# Patient Record
Sex: Female | Born: 1960 | Race: White | Hispanic: No | Marital: Married | Smoking: Current every day smoker
Health system: Southern US, Community
[De-identification: ages and names within clinical notes are randomized; demographics above are authoritative.]

## PROBLEM LIST (undated history)

## (undated) DIAGNOSIS — F329 Major depressive disorder, single episode, unspecified: Secondary | ICD-10-CM

## (undated) DIAGNOSIS — N200 Calculus of kidney: Secondary | ICD-10-CM

## (undated) DIAGNOSIS — F32A Depression, unspecified: Secondary | ICD-10-CM

## (undated) DIAGNOSIS — E119 Type 2 diabetes mellitus without complications: Secondary | ICD-10-CM

## (undated) DIAGNOSIS — Z87442 Personal history of urinary calculi: Secondary | ICD-10-CM

## (undated) DIAGNOSIS — K219 Gastro-esophageal reflux disease without esophagitis: Secondary | ICD-10-CM

## (undated) HISTORY — DX: Calculus of kidney: N20.0

## (undated) HISTORY — PX: APPENDECTOMY: SHX54

## (undated) HISTORY — PX: EXPLORATORY LAPAROTOMY: SUR591

## (undated) HISTORY — PX: CHOLECYSTECTOMY: SHX55

## (undated) HISTORY — PX: ABDOMINAL HYSTERECTOMY: SHX81

## (undated) HISTORY — PX: KIDNEY STONE SURGERY: SHX686

## (undated) HISTORY — PX: TONSILLECTOMY: SUR1361

## (undated) HISTORY — PX: OSTEOTOMY MANDIBULAR: SUR981

---

## 2004-04-29 ENCOUNTER — Ambulatory Visit: Payer: Self-pay

## 2004-10-26 ENCOUNTER — Ambulatory Visit: Payer: Self-pay | Admitting: Unknown Physician Specialty

## 2009-09-29 ENCOUNTER — Ambulatory Visit: Payer: Self-pay

## 2009-10-06 ENCOUNTER — Emergency Department: Payer: Self-pay | Admitting: Emergency Medicine

## 2009-10-24 ENCOUNTER — Emergency Department (HOSPITAL_COMMUNITY): Admission: EM | Admit: 2009-10-24 | Discharge: 2009-10-24 | Payer: Self-pay | Admitting: Emergency Medicine

## 2009-10-29 ENCOUNTER — Ambulatory Visit: Payer: Self-pay | Admitting: Urology

## 2010-05-05 LAB — POCT URINALYSIS DIPSTICK
Hgb urine dipstick: NEGATIVE
Ketones, ur: NEGATIVE mg/dL
Nitrite: NEGATIVE
Protein, ur: NEGATIVE mg/dL
pH: 5.5 (ref 5.0–8.0)

## 2010-12-01 ENCOUNTER — Ambulatory Visit: Payer: Self-pay | Admitting: Urology

## 2010-12-21 ENCOUNTER — Ambulatory Visit: Payer: Self-pay

## 2010-12-27 ENCOUNTER — Other Ambulatory Visit: Payer: Self-pay

## 2013-04-12 ENCOUNTER — Emergency Department (HOSPITAL_BASED_OUTPATIENT_CLINIC_OR_DEPARTMENT_OTHER): Payer: BC Managed Care – PPO

## 2013-04-12 ENCOUNTER — Emergency Department (HOSPITAL_BASED_OUTPATIENT_CLINIC_OR_DEPARTMENT_OTHER)
Admission: EM | Admit: 2013-04-12 | Discharge: 2013-04-12 | Disposition: A | Discharge status: Home / Self Care | Payer: BC Managed Care – PPO | Attending: Emergency Medicine | Admitting: Emergency Medicine

## 2013-04-12 ENCOUNTER — Encounter (HOSPITAL_BASED_OUTPATIENT_CLINIC_OR_DEPARTMENT_OTHER): Payer: Self-pay | Admitting: Emergency Medicine

## 2013-04-12 DIAGNOSIS — Z87891 Personal history of nicotine dependence: Secondary | ICD-10-CM | POA: Insufficient documentation

## 2013-04-12 DIAGNOSIS — Z79899 Other long term (current) drug therapy: Secondary | ICD-10-CM | POA: Insufficient documentation

## 2013-04-12 DIAGNOSIS — K219 Gastro-esophageal reflux disease without esophagitis: Secondary | ICD-10-CM | POA: Insufficient documentation

## 2013-04-12 DIAGNOSIS — E119 Type 2 diabetes mellitus without complications: Secondary | ICD-10-CM | POA: Insufficient documentation

## 2013-04-12 DIAGNOSIS — J45901 Unspecified asthma with (acute) exacerbation: Secondary | ICD-10-CM | POA: Insufficient documentation

## 2013-04-12 DIAGNOSIS — Z880 Allergy status to penicillin: Secondary | ICD-10-CM | POA: Insufficient documentation

## 2013-04-12 DIAGNOSIS — J40 Bronchitis, not specified as acute or chronic: Secondary | ICD-10-CM

## 2013-04-12 DIAGNOSIS — R609 Edema, unspecified: Secondary | ICD-10-CM | POA: Insufficient documentation

## 2013-04-12 DIAGNOSIS — IMO0002 Reserved for concepts with insufficient information to code with codable children: Secondary | ICD-10-CM | POA: Insufficient documentation

## 2013-04-12 HISTORY — DX: Gastro-esophageal reflux disease without esophagitis: K21.9

## 2013-04-12 HISTORY — DX: Type 2 diabetes mellitus without complications: E11.9

## 2013-04-12 MED ORDER — PREDNISONE 20 MG PO TABS: ORAL_TABLET | ORAL | Status: DC

## 2013-04-12 MED ORDER — ALBUTEROL SULFATE HFA 108 (90 BASE) MCG/ACT IN AERS
2.0000 | INHALATION_SPRAY | RESPIRATORY_TRACT | Status: DC
  Administered 2013-04-12: 2 via RESPIRATORY_TRACT
  Filled 2013-04-12: qty 6.7

## 2013-04-12 NOTE — ED Provider Notes (Addendum)
CSN: [id number]     Arrival date & time 04/12/13  1502 History  This chart was scribed for Rolan BuccoMelanie Analynn Daum, MD by Dorothey Basemania Sutton, ED Scribe. This patient was seen in room MH11/MH11 and the patient's care was started at 4:38 PM.    Chief Complaint  Patient presents with  . Cough   The history is provided by the patient. No language interpreter was used.   HPI Comments: Gina Oneill is a 53 y.o. Female with a history of reactive airway disease who presents to the Emergency Department complaining of a dry cough with associated, intermittent chest pain that she states has been ongoing for the past 3-4 months, but has been progressively worsening over the past few days due to the recent cold weather. She describes the chest pain as a stabbing sensation, most localized on the left side of the chest, with associated shortness of breath and wheezing that usually only presents with exertion. Patient states that she measured her pulse oximetry at home last night during one of these episodes and it was 90-91% during a cough/wheezing spell. She denies any of these symptoms at this time, but reports some associated soreness to the right side of the mid-back secondary to the cough that she states is exacerbated with coughing. She states that she finished a course of Levaquin 2-3 weeks ago for bronchitis, but has had some persistent rhinorrhea. Patient reports using Advair at home with mild, temporary relief. She denies fever, unusual leg swelling, emesis, diarrhea. Patient also has a history of DM that she states is well-controlled with metformin. Patient reports that she quit smoking about 2 years ago. She has had cough/cold symptoms since January.  Has been on 3 rounds of abx.  Started on advair.  Diagnosed with RAD.    Past Medical History  Diagnosis Date  . Diabetes mellitus without complication   . GERD (gastroesophageal reflux disease)   . Asthma    Past Surgical History  Procedure Laterality Date  .  Cholecystectomy    . Abdominal hysterectomy    . Appendectomy    . Osteotomy mandibular     No family history on file. History  Substance Use Topics  . Smoking status: Former Games developermoker  . Smokeless tobacco: Not on file  . Alcohol Use: No   OB History   Grav Para Term Preterm Abortions TAB SAB Ect Mult Living                 Review of Systems  Constitutional: Negative for fever, chills, diaphoresis and fatigue.  HENT: Positive for rhinorrhea. Negative for congestion and sneezing.   Eyes: Negative.   Respiratory: Positive for cough, shortness of breath and wheezing. Negative for chest tightness.   Cardiovascular: Positive for chest pain. Negative for leg swelling.  Gastrointestinal: Negative for nausea, vomiting, abdominal pain, diarrhea and blood in stool.  Genitourinary: Negative for frequency, hematuria, flank pain and difficulty urinating.  Musculoskeletal: Positive for back pain. Negative for arthralgias.  Skin: Negative for rash.  Neurological: Negative for dizziness, speech difficulty, weakness, numbness and headaches.   Allergies  Penicillins and Sulfa antibiotics  Home Medications   Current Outpatient Rx  Name  Route  Sig  Dispense  Refill  . FLUoxetine (PROZAC) 40 MG capsule   Oral   Take 40 mg by mouth daily.         . Fluticasone-Salmeterol (ADVAIR) 250-50 MCG/DOSE AEPB   Inhalation   Inhale 1 puff into the lungs 2 (two) times daily.         .Marland Kitchen  metFORMIN (GLUCOPHAGE) 500 MG tablet   Oral   Take by mouth daily with breakfast.         . predniSONE (DELTASONE) 20 MG tablet      3 tabs po day one, then 2 po daily x 4 days   11 tablet   0    BP 136/85  Pulse 77  Temp(Src) 98.3 F (36.8 C) (Oral)  Resp 22  Ht 5' 10.5" (1.791 m)  Wt 220 lb (99.791 kg)  BMI 31.11 kg/m2  SpO2 97%  Physical Exam  Constitutional: She is oriented to person, place, and time. She appears well-developed and well-nourished.  HENT:  Head: Normocephalic and atraumatic.   Right Ear: Hearing, tympanic membrane, external ear and ear canal normal.  Left Ear: Hearing, tympanic membrane, external ear and ear canal normal.  Eyes: Pupils are equal, round, and reactive to light.  Neck: Normal range of motion. Neck supple.  Cardiovascular: Normal rate, regular rhythm and normal heart sounds.   Pulmonary/Chest: Effort normal. No respiratory distress. She has wheezes. She has no rales. She exhibits tenderness.  Reproducible tenderness to palpation to the right mid-lateral and sternal area. Mild expiratory wheezes. No increased work of breathing.   Abdominal: Soft. Bowel sounds are normal. There is no tenderness. There is no rebound and no guarding.  Musculoskeletal: Normal range of motion. She exhibits edema.  Mild swelling to the right calf normal for patient's baseline.   Lymphadenopathy:    She has no cervical adenopathy.  Neurological: She is alert and oriented to person, place, and time.  Skin: Skin is warm and dry. No rash noted.  Psychiatric: She has a normal mood and affect.    ED Course  Procedures (including critical care time)  DIAGNOSTIC STUDIES: Oxygen Saturation is 97% on room air, normal by my interpretation.    COORDINATION OF CARE: 4:46 PM- Discussed that chest x-ray results were negative. Will discharge patient with prednisone and an albuterol inhaler to manage symptoms. Advised her to closely monitor her CBG at home while on the course of steroids. Offered patient Tussionex, but she refused because she states that she does not like its narcotic effects. Return precautions given. Discussed treatment plan with patient at bedside and patient verbalized agreement.     Labs Review Labs Reviewed - No data to display  Imaging Review Dg Chest 2 View  04/12/2013   CLINICAL DATA:  Cough  EXAM: CHEST  2 VIEW  COMPARISON:  None.  FINDINGS: The heart size and mediastinal contours are within normal limits. Both lungs are clear. The visualized skeletal  structures are unremarkable.  IMPRESSION: No active cardiopulmonary disease.   Electronically Signed   By: Ruel Favors M.D.   On: 04/12/2013 15:58    EKG Interpretation    Date/Time:  Saturday April 12 2013 15:10:55 EST Ventricular Rate:  72 PR Interval:  120 QRS Duration: 92 QT Interval:  398 QTC Calculation: 435 R Axis:   79 Text Interpretation:  Normal sinus rhythm Normal ECG No old tracing to compare Confirmed by Yunior Jain  MD, Lameshia Hypolite ([id number]) on 04/12/2013 6:22:34 PM            MDM   Final diagnoses:  Bronchitis    Patient presents with cough and wheezing for the last 2 months. She's been treated with antibiotics. There is no evidence of pneumonia on exam. She has no symptoms that would be more consistent with pulmonary embolus. Her oxygen saturations are normal and she has no tachypnea or  increased work of breathing. She currently denies any chest pain. She has no persistent tachycardia. She has no calf tenderness. She'll be treated with steroids and beta 2 agonist. She was advised close followup with her primary care physician.  I personally performed the services described in this documentation, which was scribed in my presence.  The recorded information has been reviewed and considered.     Rolan Bucco, MD 04/12/13 1737  Rolan Bucco, MD 04/12/13 Rickey Primus

## 2013-04-12 NOTE — Discharge Instructions (Signed)

## 2013-04-12 NOTE — ED Notes (Signed)
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  C/o continued cough, chest discomfort.  Using Advair since last night.  Was using Brio with some relief.  States the cold weather has exacerbated the cough.  Cough is non productive, denies fever.  C/o some musculoskeletal chest pain with movement.

## 2013-05-14 ENCOUNTER — Ambulatory Visit: Payer: Self-pay

## 2013-05-19 ENCOUNTER — Encounter: Payer: Self-pay | Admitting: Internal Medicine

## 2013-05-19 ENCOUNTER — Ambulatory Visit (INDEPENDENT_AMBULATORY_CARE_PROVIDER_SITE_OTHER): Payer: BC Managed Care – PPO | Admitting: Internal Medicine

## 2013-05-19 ENCOUNTER — Encounter (INDEPENDENT_AMBULATORY_CARE_PROVIDER_SITE_OTHER): Payer: Self-pay

## 2013-05-19 ENCOUNTER — Encounter: Payer: Self-pay | Admitting: *Deleted

## 2013-05-19 VITALS — BP 122/80 | HR 67 | Temp 97.9°F | Ht 70.5 in | Wt 219.0 lb

## 2013-05-19 DIAGNOSIS — R059 Cough, unspecified: Secondary | ICD-10-CM

## 2013-05-19 DIAGNOSIS — R058 Other specified cough: Secondary | ICD-10-CM

## 2013-05-19 DIAGNOSIS — R05 Cough: Secondary | ICD-10-CM

## 2013-05-19 MED ORDER — PREDNISONE 10 MG PO TABS
ORAL_TABLET | ORAL | Status: DC
Start: 2013-05-19 — End: 2013-06-02

## 2013-05-19 MED ORDER — PANTOPRAZOLE SODIUM 40 MG PO TBEC: 40.0000 mg | DELAYED_RELEASE_TABLET | Freq: Every day | ORAL | Status: DC

## 2013-05-19 MED ORDER — MOMETASONE FURO-FORMOTEROL FUM 100-5 MCG/ACT IN AERO: INHALATION_SPRAY | RESPIRATORY_TRACT | Status: DC

## 2013-05-19 MED ORDER — ACETAMINOPHEN-CODEINE #3 300-30 MG PO TABS
ORAL_TABLET | ORAL | Status: DC
Start: 2013-05-19 — End: 2013-06-02

## 2013-05-19 MED ORDER — FAMOTIDINE 20 MG PO TABS: ORAL_TABLET | ORAL | Status: DC

## 2013-05-19 NOTE — Patient Instructions (Signed)
If you trouble catching your breath, use dulera 100 up to 2 puffs every 12 hours as needed   Stop advair   Prednisone 10 mg take  4 each am x 2 days,   2 each am x 2 days,  1 each am x 2 days and stop   Pantoprazole (protonix) 40 mg   Take 30-60 min before first meal of the day and Pepcid 20 mg one bedtime until return to office - this is the best way to tell whether stomach acid is contributing to your problem.     GERD (REFLUX)  is an extremely common cause of respiratory symptoms, many times with no significant heartburn at all.    It can be treated with medication, but also with lifestyle changes including avoidance of late meals, excessive alcohol, smoking cessation, and avoid fatty foods, chocolate, peppermint, colas, red wine, and acidic juices such as orange juice.  NO MINT OR MENTHOL PRODUCTS SO NO COUGH DROPS  USE SUGARLESS CANDY INSTEAD (jolley ranchers or Stover's)  NO OIL BASED VITAMINS - use powdered substitutes.    Take delsym two tsp every 12 hours and supplement if needed with  Tylenol #3 one-two every 4 hours to suppress the urge to cough. Swallowing water or using ice chips/non mint and menthol containing candies (such as lifesavers or sugarless jolly ranchers) are also effective.  You should rest your voice and avoid activities that you know make you cough.  Once you have eliminated the cough for 3 straight days try reducing the tylenol #3  first,  then the delsym as tolerated.    Please schedule a follow up office visit in 2 weeks, sooner if needed

## 2013-05-19 NOTE — Progress Notes (Addendum)
Subjective:    Patient ID: Gina FootSheila Oneill, female    DOB: 1960/08/10  MRN: [id number]  HPI  52 yowf quit smoking 11/2010 p dx of asthma ? Around age 53 then again again 1994 by [facility] weaned to advair/singulair/albuterol and eval in the 1990's for allergies on shots by ENT x 3 y no benefit and stopped all meds early 2000 and did fine despite smoking with only flares twice yearly since then controlled with prn tussionex no pred no inhalers and then quit smoking 2012 and then got worse with more freq head /chest congestion rx with freq abx by Dr Vear ClockPhillips more persistent nasal and chest congestion/ then more doe starting Jan 2015 when got new outside bike, better on advair initially in Jan then mid februrary 2015 developed desats assoc with R >L CP so [facility] much better p alb/ prednisone.   05/19/2013 1st [place] [facility] office visit/ Adaria Hole maint onadvair 250 bid  Chief Complaint  Patient presents with  . Pulmonary Consult    Self referral- pt c/o cough, SOB and CP since mid Jan 2015. Pt states that she gets SOB with any exertion and sometimes even with just walking. CP also bothers her "all the time" but worse upon deep inspiration.     cp is diffuse ant > post and only started p the cough which is harsh, barking, upper airway in quality with min mucoid sputum. advair not helping- nothing ever helps including saba   No obvious other patterns in day to day or daytime variabilty or assoc   chest tightness, subjective wheeze overt sinus or hb symptoms. No unusual exp hx or h/o childhood pna/ asthma or knowledge of premature birth.   Also denies any obvious fluctuation of symptoms with weather or environmental changes or other aggravating or alleviating factors except as outlined above   Current Medications, Allergies, Complete Past Medical History, Past Surgical History, Family History, and Social History were reviewed in [facility] electronic medical record.            Review of Systems  Constitutional: Negative for fever, chills and unexpected weight change.  HENT: Positive for ear pain and sore throat. Negative for congestion, dental problem, nosebleeds, postnasal drip, rhinorrhea, sinus pressure, sneezing, trouble swallowing and voice change.   Eyes: Negative for visual disturbance.  Respiratory: Positive for cough and shortness of breath. Negative for choking.   Cardiovascular: Positive for chest pain. Negative for leg swelling.  Gastrointestinal: Negative for vomiting, abdominal pain and diarrhea.  Genitourinary: Negative for difficulty urinating.  Musculoskeletal: Negative for arthralgias.  Skin: Negative for rash.  Neurological: Positive for headaches. Negative for tremors and syncope.  Hematological: Does not bruise/bleed easily.       Objective:   Physical Exam  amb wf nad mod hoarse nad with harsh barking cough   Wt Readings from Last 3 Encounters:  05/19/13 219 lb (99.338 kg)  04/12/13 220 lb (99.791 kg)    HEENT: nl dentition, turbinates, and orophanx. Nl external ear canals without cough reflex   NECK :  without JVD/Nodes/TM/ nl carotid upstrokes bilaterally   LUNGS: no acc muscle use, clear to A and P bilaterally without cough on insp or exp maneuvers   CV:  RRR  no s3 or murmur or increase in P2, no edema   ABD:  soft and nontender with nl excursion in the supine position. No bruits or organomegaly, bowel sounds nl  MS:  warm without deformities, calf tenderness, cyanosis or  clubbing  SKIN: warm and dry without lesions    NEURO:  alert, approp, no deficits    cxr 04/12/13  No active cardiopulmonary disease.  cxr repeated 05/14/13 c/w bronchitis       Assessment & Plan:

## 2013-05-20 DIAGNOSIS — R058 Other specified cough: Secondary | ICD-10-CM | POA: Insufficient documentation

## 2013-05-20 DIAGNOSIS — R05 Cough: Secondary | ICD-10-CM | POA: Insufficient documentation

## 2013-05-20 NOTE — Assessment & Plan Note (Addendum)
The most common causes of chronic cough in immunocompetent adults include the following: upper airway cough syndrome (UACS), previously referred to as postnasal drip syndrome (PNDS), which is caused by variety of rhinosinus conditions; (2) asthma; (3) GERD; (4) chronic bronchitis from cigarette smoking or other inhaled environmental irritants; (5) nonasthmatic eosinophilic bronchitis; and (6) bronchiectasis.   These conditions, singly or in combination, have accounted for up to 94% of the causes of chronic cough in prospective studies.   Other conditions have constituted no >6% of the causes in prospective studies These have included bronchogenic carcinoma, chronic interstitial pneumonia, sarcoidosis, left ventricular failure, ACEI-induced cough, and aspiration from a condition associated with pharyngeal dysfunction.    Chronic cough is often simultaneously caused by more than one condition. A single cause has been found from 38 to 82% of the time, multiple causes from 18 to 62%. Multiply caused cough has been the result of three diseases up to 42% of the time.       Based on hx and exam, this is most likely:  Classic Upper airway cough syndrome, so named because it's frequently impossible to sort out how much is  CR/sinusitis with freq throat clearing (which can be related to primary GERD)   vs  causing  secondary (" extra esophageal")  GERD from wide swings in gastric pressure that occur with throat clearing, often  promoting self use of mint and menthol lozenges that reduce the lower esophageal sphincter tone and exacerbate the problem further in a cyclical fashion.   These are the same pts (now being labeled as having "irritable larynx syndrome" by some cough centers) who not infrequently have a history of having failed to tolerate ace inhibitors,  dry powder inhalers or biphosphonates or report having atypical reflux symptoms that don't respond to standard doses of PPI , and are easily confused as  having aecopd or asthma flares by even experienced allergists/ pulmonologists.   The first step is to maximize acid suppression/GERD rx and eliminate cyclical coughing then regroup if the cough persists. Will hold inhalers unless can't catch breath p first eliminating the cough which I believe is the problem, not the symptom of a problem. Next step if not better > sinus CT/allergy eval   See instructions for specific recommendations which were reviewed directly with the patient who was given a copy with highlighter outlining the key components.

## 2013-05-26 ENCOUNTER — Telehealth: Payer: Self-pay | Admitting: Internal Medicine

## 2013-05-26 NOTE — Telephone Encounter (Signed)
Pt calling again would like a call back asap.Caren GriffinsStanley A Dalton

## 2013-05-26 NOTE — Telephone Encounter (Signed)
Spoke with the Gina Oneill  She states that she is feeling much better Cough has not completely resolved, but almost  She is requesting note to return to work now  Needs to be faxed to Hosie Poissonlizabeth Goad at [phone] She is aware MW out of the office until next wk  Please advise, thanks!

## 2013-05-27 ENCOUNTER — Encounter: Payer: Self-pay | Admitting: *Deleted

## 2013-05-27 NOTE — Telephone Encounter (Signed)
Ok to write letter:  approve for work anytime she wants to return

## 2013-05-27 NOTE — Telephone Encounter (Signed)
Letter created and faxed to Hosie PoissonElizabeth Goad to the number given  [facility] for the pt

## 2013-05-28 NOTE — Telephone Encounter (Signed)
Pt advised. Jennifer Castillo, CMA  

## 2013-06-02 ENCOUNTER — Encounter (INDEPENDENT_AMBULATORY_CARE_PROVIDER_SITE_OTHER): Payer: Self-pay

## 2013-06-02 ENCOUNTER — Ambulatory Visit (INDEPENDENT_AMBULATORY_CARE_PROVIDER_SITE_OTHER): Payer: BC Managed Care – PPO | Admitting: Internal Medicine

## 2013-06-02 ENCOUNTER — Encounter: Payer: Self-pay | Admitting: Internal Medicine

## 2013-06-02 VITALS — BP 114/80 | HR 63 | Temp 98.6°F | Ht 70.5 in | Wt 216.8 lb

## 2013-06-02 DIAGNOSIS — R059 Cough, unspecified: Secondary | ICD-10-CM

## 2013-06-02 DIAGNOSIS — R05 Cough: Secondary | ICD-10-CM

## 2013-06-02 DIAGNOSIS — R058 Other specified cough: Secondary | ICD-10-CM

## 2013-06-02 MED ORDER — DOXYCYCLINE HYCLATE 100 MG PO TABS: 100.0000 mg | ORAL_TABLET | Freq: Two times a day (BID) | ORAL | Status: DC

## 2013-06-02 MED ORDER — AMOXICILLIN-POT CLAVULANATE 875-125 MG PO TABS: ORAL_TABLET | ORAL | Status: DC

## 2013-06-02 MED ORDER — PREDNISONE 10 MG PO TABS: ORAL_TABLET | ORAL | Status: DC

## 2013-06-02 MED ORDER — ACETAMINOPHEN-CODEINE #3 300-30 MG PO TABS: ORAL_TABLET | ORAL | Status: DC

## 2013-06-02 NOTE — Progress Notes (Signed)
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 age 53 then again again 1994 by [facility] weaned to advair/singulair/albuterol and eval in the 1990's for allergies on shots by ENT x 3 y no benefit and stopped all meds early 2000 and did fine despite smoking with only flares twice yearly since then controlled with prn tussionex no pred no inhalers and then quit smoking 2012 and then got worse with more freq head /chest congestion rx with freq abx by Dr Vear ClockPhillips more persistent nasal and chest congestion/ then more doe starting Jan 2015 when got new outside bike, better on advair initially in Jan then mid februrary 2015 developed desats assoc with R >L CP [facility] much better p alb/ prednisone.   History of Present Illness  05/19/2013 1st [place] [facility] office visit/ Gina Oneill maint on advair 250 bid  Chief Complaint  Patient presents with  . Pulmonary Consult    Self referral- pt c/o cough, SOB and CP since mid Jan 2015. Pt states that she gets SOB with any exertion and sometimes even with just walking. CP also bothers her "all the time" but worse upon deep inspiration.     cp is diffuse ant > post and only started p the cough which is harsh, barking, upper airway in quality with min mucoid sputum. advair not helping- nothing ever helps including saba  rec If you trouble catching your breath, use dulera 100 up to 2 puffs every 12 hours as needed  Stop advair  Prednisone 10 mg take  4 each am x 2 days,   2 each am x 2 days,  1 each am x 2 days and stop  Pantoprazole (protonix) 40 mg   Take 30-60 min before first meal of the day and Pepcid 20 mg one bedtime until return to office - this is the best way to tell whether stomach acid is contributing to your problem.   GERD  Take delsym two tsp every 12 hours and supplement if needed with  Tylenol #3 one-two every 4  hours to suppress the urge to cough.  Once you have eliminated the cough for 3 straight days try reducing the tylenol #3  First.   06/02/2013 f/u ov/Gina Oneill re: ? Cough variant asthma Chief Complaint  Patient presents with  . Follow-up    Pt reports that she was much better after last visit, but worse x 5 days and back to where she was before.   nasal tone to voice/ green mucus from nose with weather change x 5 d prior to OV   But no sob Stopped all meds prior to exac  No obvious day to day or daytime variabilty or assoc chronic cough or cp or chest tightness, subjective wheeze overt sinus or hb symptoms. No unusual exp hx or h/o childhood pna/ asthma or knowledge of premature birth.  Sleeping ok without nocturnal  or early am exacerbation  of respiratory  c/o's or need for noct saba. Also denies any obvious fluctuation of symptoms with weather or environmental changes or other aggravating or alleviating factors except as outlined above   Current Medications, Allergies, Complete Past Medical History, Past Surgical History, Family History, and Social History were reviewed in [facility] electronic medical record.  ROS  The following are not active complaints unless bolded sore throat,  dysphagia, dental problems, itching, sneezing,  nasal congestion or excess/ purulent secretions, ear ache,   fever, chills, sweats, unintended wt loss, pleuritic or exertional cp, hemoptysis,  orthopnea pnd or leg swelling, presyncope, palpitations, heartburn, abdominal pain, anorexia, nausea, vomiting, diarrhea  or change in bowel or urinary habits, change in stools or urine, dysuria,hematuria,  rash, arthralgias, visual complaints, headache, numbness weakness or ataxia or problems with walking or coordination,  change in mood/affect or memory.        .               Objective:   Physical Exam  amb wf nad mod hoarse nad with harsh barking cough    Wt Readings from Last 3 Encounters:  06/02/13 216 lb  12.8 oz (98.34 kg)  05/19/13 219 lb (99.338 kg)  04/12/13 220 lb (99.791 kg)       HEENT: nl dentition, turbinates, and orophanx. Nl external ear canals without cough reflex   NECK :  without JVD/Nodes/TM/ nl carotid upstrokes bilaterally   LUNGS: no acc muscle use, clear to A and P bilaterally without cough on insp or exp maneuvers   CV:  RRR  no s3 or murmur or increase in P2, no edema   ABD:  soft and nontender with nl excursion in the supine position. No bruits or organomegaly, bowel sounds nl  MS:  warm without deformities, calf tenderness, cyanosis or clubbing  SKIN: warm and dry without lesions    NEURO:  alert, approp, no deficits    cxr 04/12/13  No active cardiopulmonary disease.  cxr repeated 05/14/13 c/w bronchitis       Assessment & Plan:

## 2013-06-02 NOTE — Patient Instructions (Addendum)
use dulera 100 up to 2 puffs every 12 hours  As a maintenance med   Prednisone 10 mg take  4 each am x 2 days,   2 each am x 2 days,  1 each am x 2 days and stop - if mucus stays green add augmentin x 10 days  Continue Pantoprazole (protonix) 40 mg   Take 30-60 min before first meal of the day and Pepcid 20 mg one bedtime until return to office - this is the best way to tell whether stomach acid is contributing to your problem.     GERD (REFLUX)  is an extremely common cause of respiratory symptoms, many times with no significant heartburn at all.    It can be treated with medication, but also with lifestyle changes including avoidance of late meals, excessive alcohol, smoking cessation, and avoid fatty foods, chocolate, peppermint, colas, red wine, and acidic juices such as orange juice.  NO MINT OR MENTHOL PRODUCTS SO NO COUGH DROPS  USE SUGARLESS CANDY INSTEAD (jolley ranchers or Stover's)  NO OIL BASED VITAMINS - use powdered substitutes.    Take delsym two tsp every 12 hours and supplement if needed with  Tylenol #3 one-two every 4 hours to suppress the urge to cough. Swallowing water or using ice chips/non mint and menthol containing candies (such as lifesavers or sugarless jolly ranchers) are also effective.  You should rest your voice and avoid activities that you know make you cough.  Once you have eliminated the cough for 3 straight days try reducing the tylenol #3  first,  then the delsym as tolerated.    Please schedule a follow up office visit in 2 weeks, sooner if needed

## 2013-06-05 NOTE — Assessment & Plan Note (Signed)
Cough variant asthma vs  Classic Upper airway cough syndrome, so named because it's frequently impossible to sort out how much is  CR/sinusitis with freq throat clearing (which can be related to primary GERD)   vs  causing  secondary (" extra esophageal")  GERD from wide swings in gastric pressure that occur with throat clearing, often  promoting self use of mint and menthol lozenges that reduce the lower esophageal sphincter tone and exacerbate the problem further in a cyclical fashion.   These are the same pts (now being labeled as having "irritable larynx syndrome" by some cough centers) who not infrequently have a history of having failed to tolerate ace inhibitors,  dry powder inhalers or biphosphonates or report having atypical reflux symptoms that don't respond to standard doses of PPI , and are easily confused as having aecopd or asthma flares by even experienced allergists/ pulmonologists.   The problem is that many inhalers make the UACS worse  Try dulera 100 2bid and repeat cyclical cough regimen then regroup in 2 weeks  See instructions for specific recommendations which were reviewed directly with the patient who was given a copy with highlighter outlining the key components.   The proper method of use, as well as anticipated side effects, of a metered-dose inhaler are discussed and demonstrated to the patient. Improved effectiveness after extensive coaching during this visit to a level of approximately  75%

## 2013-06-16 ENCOUNTER — Encounter: Payer: Self-pay | Admitting: *Deleted

## 2013-06-16 ENCOUNTER — Ambulatory Visit (INDEPENDENT_AMBULATORY_CARE_PROVIDER_SITE_OTHER): Payer: BC Managed Care – PPO | Admitting: Internal Medicine

## 2013-06-16 ENCOUNTER — Encounter (INDEPENDENT_AMBULATORY_CARE_PROVIDER_SITE_OTHER): Payer: Self-pay

## 2013-06-16 ENCOUNTER — Other Ambulatory Visit (INDEPENDENT_AMBULATORY_CARE_PROVIDER_SITE_OTHER): Payer: BC Managed Care – PPO

## 2013-06-16 ENCOUNTER — Encounter: Payer: Self-pay | Admitting: Internal Medicine

## 2013-06-16 VITALS — BP 112/70 | HR 75 | Temp 98.0°F | Ht 70.5 in | Wt 213.4 lb

## 2013-06-16 DIAGNOSIS — R05 Cough: Secondary | ICD-10-CM

## 2013-06-16 DIAGNOSIS — R059 Cough, unspecified: Secondary | ICD-10-CM

## 2013-06-16 DIAGNOSIS — R058 Other specified cough: Secondary | ICD-10-CM

## 2013-06-16 LAB — CBC WITH DIFFERENTIAL/PLATELET
BASOS PCT: 0.3 % (ref 0.0–3.0)
Basophils Absolute: 0 10*3/uL (ref 0.0–0.1)
EOS ABS: 0.3 10*3/uL (ref 0.0–0.7)
Eosinophils Relative: 2.4 % (ref 0.0–5.0)
HCT: 44.3 % (ref 36.0–46.0)
HEMOGLOBIN: 15.5 g/dL — AB (ref 12.0–15.0)
LYMPHS ABS: 2.6 10*3/uL (ref 0.7–4.0)
Lymphocytes Relative: 20.5 % (ref 12.0–46.0)
MCHC: 34.9 g/dL (ref 30.0–36.0)
MCV: 85.2 fl (ref 78.0–100.0)
MONO ABS: 0.5 10*3/uL (ref 0.1–1.0)
Monocytes Relative: 4.2 % (ref 3.0–12.0)
NEUTROS ABS: 9.3 10*3/uL — AB (ref 1.4–7.7)
Neutrophils Relative %: 72.6 % (ref 43.0–77.0)
Platelets: 392 10*3/uL (ref 150.0–400.0)
RBC: 5.2 Mil/uL — AB (ref 3.87–5.11)
RDW: 15.3 % — ABNORMAL HIGH (ref 11.5–14.6)
WBC: 12.9 10*3/uL — ABNORMAL HIGH (ref 4.5–10.5)

## 2013-06-16 MED ORDER — MONTELUKAST SODIUM 10 MG PO TABS: ORAL_TABLET | ORAL | Status: DC

## 2013-06-16 MED ORDER — PREDNISONE 10 MG PO TABS: ORAL_TABLET | ORAL | Status: DC

## 2013-06-16 MED ORDER — FLUTTER DEVI: Status: DC

## 2013-06-16 NOTE — Assessment & Plan Note (Signed)
   I had an extended discussion with the patient today lasting 15 to 20 minutes of a 25 minute visit on the following issues:   Regardless of the exact trigger, this cough is so violent that it will very quickly evolved into cyclica coughing, so the goal is to eliminate the cycle of cough while exploring the triggering mechanism(check ct sinus, allergy profile)  and start a new maint rx with singulair trial   See instructions for specific recommendations which were reviewed directly with the patient who was given a copy with highlighter outlining the key components.

## 2013-06-16 NOTE — Progress Notes (Signed)
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53 then again again 1994 by [facility] weaned to advair/singulair/albuterol and eval in the 1990's for allergies on shots by ENT x 3 y no benefit and stopped all meds early 2000 and did fine despite smoking with only flares twice yearly since then controlled with prn tussionex no pred no inhalers and then quit smoking 2012 and then got worse with more freq head /chest congestion rx with freq abx by Dr Gina Oneill more persistent nasal and chest congestion/ then more doe starting Jan 2015 when got new outside bike, better on advair initially in Jan then mid februrary 2015 developed desats assoc with R >L CP so went to [facility] 05/10/13 much better p alb/ prednisone.   History of Present Illness  05/19/2013 1st [facility]/ [facility] maint on advair 250 bid  Chief Complaint  Patient presents with  . Pulmonary Consult    Self referral- pt c/o cough, SOB and CP since mid Jan 2015. Pt states that she gets SOB with any exertion and sometimes even with just walking. CP also bothers her "all the time" but worse upon deep inspiration.     cp is diffuse ant > post and only started p the cough which is harsh, barking, upper airway in quality with min mucoid sputum. advair not helping- nothing ever helps including saba  rec If you trouble catching your breath, use dulera 100 up to 2 puffs every 12 hours as needed  Stop advair  Prednisone 10 mg take  4 each am x 2 days,   2 each am x 2 days,  1 each am x 2 days and stop  Pantoprazole (protonix) 40 mg   Take 30-60 min before first meal of the day and Pepcid 20 mg one bedtime until return to office - this is the best way to tell whether stomach acid is contributing to your problem.   GERD  Take delsym two tsp every 12 hours and supplement if needed with  Tylenol #3 one-two every 4  hours to suppress the urge to cough.  Once you have eliminated the cough for 3 straight days try reducing the tylenol #3  First.   06/02/2013 f/u ov/Gina Oneill re: ? Cough variant asthma Chief Complaint  Patient presents with  . Follow-up    Pt reports that she was much better after last Oneill, but worse x 5 days and back to where she was before.   nasal tone to voice/ green mucus from nose with weather change x 5 d prior to OV   But no sob Stopped all meds prior to exac rec use dulera 100 up to 2 puffs every 12 hours  As a maintenance med Prednisone 10 mg take  4 each am x 2 days,   2 each am x 2 days,  1 each am x 2 days and stop  Continue Pantoprazole (protonix) 40 mg   Take 30-60 min before first meal of the day and Pepcid 20 mg one bedtime     GERD   Take delsym two tsp every 12 hours and supplement if needed with  Tylenol #3 one-two every 4 hours   06/16/2013 f/u ov/Gina Oneill re: completely better off codeine acutely worse x24 h while on dulera 100 and gerd rx Chief Complaint  Patient presents  with  . Follow-up    Pt reports that her cough was almost completely resovled, until 1 day ago started back coughing- non prod. She relates this to the rain and cold weather.       No obvious other patterns in  day to day or daytime variabilty or assoc sob  cp or chest tightness, subjective wheeze overt sinus or hb symptoms. No unusual exp hx or h/o childhood pna/ asthma or knowledge of premature birth.  Sleeping ok without nocturnal  or early am exacerbation  of respiratory  c/o's or need for noct saba. Also denies any obvious fluctuation of symptoms with weather or environmental changes or other aggravating or alleviating factors except as outlined above   Current Medications, Allergies, Complete Past Medical History, Past Surgical History, Family History, and Social History were reviewed in [facility] electronic medical record.  ROS  The following are not active complaints unless bolded sore  throat, dysphagia, dental problems, itching, sneezing,  nasal congestion or excess/ purulent secretions, ear ache,   fever, chills, sweats, unintended wt loss, pleuritic or exertional cp, hemoptysis,  orthopnea pnd or leg swelling, presyncope, palpitations, heartburn, abdominal pain, anorexia, nausea, vomiting, diarrhea  or change in bowel or urinary habits, change in stools or urine, dysuria,hematuria,  rash, arthralgias, visual complaints, headache, numbness weakness or ataxia or problems with walking or coordination,  change in mood/affect or memory.        .               Objective:   Physical Exam  amb wf nad min hoarse but very harsh barking upper airway pattern cough   06/16/2013        213  Wt Readings from Last 3 Encounters:  06/02/13 216 lb 12.8 oz (98.34 kg)  05/19/13 219 lb (99.338 kg)  04/12/13 220 lb (99.791 kg)       HEENT: nl dentition, turbinates, and orophanx. Nl external ear canals without cough reflex   NECK :  without JVD/Nodes/TM/ nl carotid upstrokes bilaterally   LUNGS: no acc muscle use, clear to A and P bilaterally without cough on insp or exp maneuvers   CV:  RRR  no s3 or murmur or increase in P2, no edema   ABD:  soft and nontender with nl excursion in the supine position. No bruits or organomegaly, bowel sounds nl  MS:  warm without deformities, calf tenderness, cyanosis or clubbing  SKIN: warm and dry without lesions    NEURO:  alert, approp, no deficits    cxr 04/12/13  No active cardiopulmonary disease.  cxr repeated 05/14/13 c/w bronchitis       Assessment & Plan:

## 2013-06-16 NOTE — Patient Instructions (Addendum)
Prednisone 10 mg take  4 each am x 2 days,   2 each am x 2 days,  1 each am x 2 days and stop   Add back the singulair 10 mg one daily   Out of work until April 29  Please see patient coordinator before you leave today  to schedule sinus ct  Please remember to go to the lab   department downstairs for your tests - we will call you with the results when they are available.  Every time you cough use the flutter valve to cough into and suppress the cough tylenol #3/delsym  Please schedule a follow up office visit in 2 weeks, sooner if needed

## 2013-06-17 ENCOUNTER — Encounter: Payer: Self-pay | Admitting: Internal Medicine

## 2013-06-17 LAB — ALLERGY PROFILE REGION II-DC, DE, MD, ~~LOC~~, VA
ALLERGEN, D PTERNOYSSINUS, D1: 0.29 kU/L — AB
Alternaria Alternata: <0.1 kU/L
CAT DANDER: 0.2 kU/L — AB
Cockroach: <0.1 kU/L
D. FARINAE: 0.36 kU/L — AB
Dog Dander: <0.1 kU/L
IgE (Immunoglobulin E), Serum: 28 IU/mL (ref 0.0–180.0)
Johnson Grass: <0.1 kU/L
Lamb's Quarters: <0.1 kU/L
Meadow Grass: <0.1 kU/L
Pecan/Hickory Tree IgE: <0.1 kU/L

## 2013-06-17 NOTE — Progress Notes (Signed)
Quick Note:  Spoke with pt and notified of results per Dr. Wert. Pt verbalized understanding and denied any questions.  ______ 

## 2013-06-18 ENCOUNTER — Ambulatory Visit (INDEPENDENT_AMBULATORY_CARE_PROVIDER_SITE_OTHER)
Admission: RE | Admit: 2013-06-18 | Discharge: 2013-06-18 | Disposition: A | Discharge status: Home / Self Care | Payer: BC Managed Care – PPO | Source: Ambulatory Visit | Attending: Internal Medicine | Admitting: Internal Medicine

## 2013-06-18 ENCOUNTER — Encounter: Payer: Self-pay | Admitting: Internal Medicine

## 2013-06-18 DIAGNOSIS — R058 Other specified cough: Secondary | ICD-10-CM

## 2013-06-18 DIAGNOSIS — R05 Cough: Secondary | ICD-10-CM

## 2013-06-18 DIAGNOSIS — R059 Cough, unspecified: Secondary | ICD-10-CM

## 2013-06-18 NOTE — Progress Notes (Signed)
Quick Note:  lmtcb X1 to relay results. ______ 

## 2013-06-19 ENCOUNTER — Telehealth: Payer: Self-pay | Admitting: Internal Medicine

## 2013-06-19 NOTE — Telephone Encounter (Signed)
Notes Recorded by Nyoka CowdenMichael B Wert, MD on 06/18/2013 at 3:36 PM Call patient : Study is unremarkable, ok to add chlortrimeton (chlorpheniramine) 4 mg every 4 hours available over the counter (may cause drowsiness but very good at eliminating nasal drainage as source of cough      Spoke with pt and notified of results per Dr. Sherene SiresWert. Pt verbalized understanding. She states that she read the report on MyChart and wants to know what to do about the mucous cyst or polyp radiologist found on CT  Please advise, thanks!

## 2013-06-19 NOTE — Telephone Encounter (Signed)
Pt is aware. Does not want referral. Nothing further needed

## 2013-06-19 NOTE — Telephone Encounter (Signed)
These are very common, don't usually ever cause problems but happy to refer to ent for second opinion is she has any questions at all re the management

## 2013-06-20 NOTE — Progress Notes (Signed)
Quick Note:  Pt notified of results  See phone note dated 06/19/13 ______

## 2013-06-30 ENCOUNTER — Encounter: Payer: Self-pay | Admitting: Internal Medicine

## 2013-06-30 ENCOUNTER — Ambulatory Visit (INDEPENDENT_AMBULATORY_CARE_PROVIDER_SITE_OTHER): Payer: BC Managed Care – PPO | Admitting: Internal Medicine

## 2013-06-30 ENCOUNTER — Encounter: Payer: Self-pay | Admitting: *Deleted

## 2013-06-30 VITALS — BP 118/80 | HR 64 | Temp 98.0°F | Ht 70.5 in | Wt 217.0 lb

## 2013-06-30 DIAGNOSIS — R05 Cough: Secondary | ICD-10-CM

## 2013-06-30 DIAGNOSIS — R059 Cough, unspecified: Secondary | ICD-10-CM

## 2013-06-30 DIAGNOSIS — R058 Other specified cough: Secondary | ICD-10-CM

## 2013-06-30 MED ORDER — GABAPENTIN 100 MG PO CAPS
100.0000 mg | ORAL_CAPSULE | Freq: Three times a day (TID) | ORAL | Status: DC
Start: 2013-06-30 — End: 2013-07-28

## 2013-06-30 NOTE — Progress Notes (Signed)
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 by [facility] weaned to advair/singulair/albuterol and eval in the 1990's for allergies on shots by ENT x 3 y no benefit and stopped all meds early 2000 and did fine despite smoking with only flares twice yearly since then controlled with prn tussionex no pred no inhalers and then quit smoking 2012 and then got worse with more freq head /chest congestion rx with freq abx by Dr Vear Clock more persistent nasal and chest congestion/ then more doe starting Jan 2015 when got new outside bike, better on advair initially in Jan then mid februrary 2015 developed desats assoc with R >L CP so went to [facility] 05/10/13 much better p alb/ prednisone.   History of Present Illness  05/19/2013 1st [facility] office visit/ Crit [place] maint on advair 250 bid  Chief Complaint  Patient presents with  . Pulmonary Consult    Self referral- pt c/o cough, SOB and CP since mid Jan 2015. Pt states that she gets SOB with any exertion and sometimes even with just walking. CP also bothers her "all the time" but worse upon deep inspiration.     cp is diffuse ant > post and only started p the cough which is harsh, barking, upper airway in quality with min mucoid sputum. advair not helping- nothing ever helps including saba  rec If you trouble catching your breath, use dulera 100 up to 2 puffs every 12 hours as needed  Stop advair  Prednisone 10 mg take  4 each am x 2 days,   2 each am x 2 days,  1 each am x 2 days and stop  Pantoprazole (protonix) 40 mg   Take 30-60 min before first meal of the day and Pepcid 20 mg one bedtime until return to office - this is the best way to tell whether stomach acid is contributing to your problem.   GERD  Take delsym two tsp every 12 hours and supplement if needed with  Tylenol #3 one-two every 4  hours to suppress the urge to cough.  Once you have eliminated the cough for 3 straight days try reducing the tylenol #3  First.   06/02/2013 f/u ov/Vernelle Wisner re: ? Cough variant asthma Chief Complaint  Patient presents with  . Follow-up    Pt reports that she was much better after last visit, but worse x 5 days and back to where she was before.   nasal tone to voice/ green mucus from nose with weather change x 5 d prior to OV   But no sob Stopped all meds prior to exac rec use dulera 100 up to 2 puffs every 12 hours  As a maintenance med Prednisone 10 mg take  4 each am x 2 days,   2 each am x 2 days,  1 each am x 2 days and stop  Continue Pantoprazole (protonix) 40 mg   Take 30-60 min before first meal of the day and Pepcid 20 mg one bedtime   GERD   Take delsym two tsp every 12 hours and supplement if needed with  Tylenol #3 one-two every 4 hours   06/16/2013 f/u ov/Burnis Kaser re: completely better off codeine acutely worse x24 h while on dulera 100 and gerd rx Chief Complaint  Patient presents with  .  Follow-up    Pt reports that her cough was almost completely resovled, until 1 day ago started back coughing- non prod. She relates this to the rain and cold weather.    rec Prednisone 10 mg take  4 each am x 2 days,   2 each am x 2 days,  1 each am x 2 days and stop  Add back the singulair 10 mg one daily  Out of work until April 29 Please see patient coordinator before you leave today  to schedule sinus ct Please remember to go to the lab department downstairs for your tests - we will call you with the results when they are available. Every time you cough use the flutter valve to cough into and suppress the cough tylenol #3/delsym   06/30/2013 f/u ov/Shelisha Gautier re: hacky cough since xmas,  Cp gone able to jog Chief Complaint  Patient presents with  . Follow-up    Cough is unchanged since last visit. No new co's today.   cough is dry, more when awake - sleeping cough is resolved, 30 min p  awakening urge to cough recurs, not better with H1 Stopped dulera 2 days prior to OV  And no change as yet Cough 100% resolved while Tylenol # 3 and after stopped May 7thx 2 days began coughing again May 9th but only 25% as bad as when she was at her worst and no sob.     [name] Reflux Comments  Do you awaken from a sound sleep coughing violently?                            With trouble breathing? no   Do you have choking episodes when you cannot  Get enough air, gasping for air ?              no   Do you usually cough when you lie down into  The bed, or when you just lie down to rest ?                          no   Do you usually cough after meals or eating?         no   Do you cough when (or after) you bend over?    no   GERD SCORE     [name] Neurogenic   Do you more-or-less cough all day long? no   Does change of temperature make you cough? yes   Does laughing or chuckling cause you to cough? no   Do fumes (perfume, automobile fumes, burned  Toast, etc.,) cause you to cough ?      yes   Does speaking, singing, or talking on the phone cause you to cough   ?               No    Neurogenic/Airway score         No obvious other patterns in  day to day or daytime variabilty or assoc   chest tightness, subjective wheeze overt sinus or hb symptoms. No unusual exp hx or h/o childhood pna/ asthma or knowledge of premature birth.  Sleeping ok without nocturnal  or early am exacerbation  of respiratory  c/o's or need for noct saba. Also denies any obvious fluctuation of symptoms with weather or environmental changes or other aggravating or  alleviating factors except as outlined above   Current Medications, Allergies, Complete Past Medical History, Past Surgical History, Family History, and Social History were reviewed in [facility] electronic medical record.  ROS  The following are not active  complaints unless bolded sore throat, dysphagia, dental problems, itching, sneezing,  nasal congestion or excess/ purulent secretions, ear ache,   fever, chills, sweats, unintended wt loss, pleuritic or exertional cp, hemoptysis,  orthopnea pnd or leg swelling, presyncope, palpitations, heartburn, abdominal pain, anorexia, nausea, vomiting, diarrhea  or change in bowel or urinary habits, change in stools or urine, dysuria,hematuria,  rash, arthralgias, visual complaints, headache, numbness weakness or ataxia or problems with walking or coordination,  change in mood/affect or memory.        .               Objective:   Physical Exam  amb somber  wf nad  No longer  hoarse, no barking cough  06/16/2013        213 >  06/30/2013 217 Wt Readings from Last 3 Encounters:  06/02/13 216 lb 12.8 oz (98.34 kg)  05/19/13 219 lb (99.338 kg)  04/12/13 220 lb (99.791 kg)       HEENT: nl dentition, turbinates, and orophanx. Nl external ear canals without cough reflex   NECK :  without JVD/Nodes/TM/ nl carotid upstrokes bilaterally   LUNGS: no acc muscle use, clear to A and P bilaterally without cough on insp or exp maneuvers   CV:  RRR  no s3 or murmur or increase in P2, no edema   ABD:  soft and nontender with nl excursion in the supine position. No bruits or organomegaly, bowel sounds nl  MS:  warm without deformities, calf tenderness, cyanosis or clubbing  SKIN: warm and dry without lesions    NEURO:  alert, approp, no deficits    cxr 04/12/13  No active cardiopulmonary disease.  cxr repeated 05/14/13 c/w bronchitis       Assessment & Plan:

## 2013-06-30 NOTE — Assessment & Plan Note (Signed)
-   Allergy Rhinitis .06/16/2013 >  IgE 28 pos cat / dust - Singulair/flutter trial 06/16/2013  - CT sinus Mucous retention cyst or polyp in the left maxillary sinus.>>No findings for acute sinusitis.    Based on cough questionaire this best fits with irritable larynx syndrome and the fact that it broke through dulera and no worse p stopped it against asthma but needs MCT to be complete> ordered  Discussed in detail all the  indications, usual  risks and alternatives  relative to the benefits with patient who agrees to proceed with bronchoscopy with neurontin trial since cannot afford to miss work or be sleepy at work so no more narcotic cough meds for now > try neurontin 100 tid x 4 weeks trial while staying on gerd rx/ diet  See instructions for specific recommendations which were reviewed directly with the patient who was given a copy with highlighter outlining the key components.

## 2013-06-30 NOTE — Patient Instructions (Addendum)
neurontin 100 mg three times a day with meals and leave off the dulera  No other changes for now  Please see patient coordinator before you leave today  to schedule asthma challenge test   Please schedule a follow up office visit in 4 weeks, sooner if needed

## 2013-07-08 ENCOUNTER — Ambulatory Visit (HOSPITAL_COMMUNITY)
Admission: RE | Admit: 2013-07-08 | Discharge: 2013-07-08 | Disposition: A | Discharge status: Home / Self Care | Payer: BC Managed Care – PPO | Source: Ambulatory Visit | Attending: Internal Medicine | Admitting: Internal Medicine

## 2013-07-08 DIAGNOSIS — R05 Cough: Secondary | ICD-10-CM

## 2013-07-08 DIAGNOSIS — R058 Other specified cough: Secondary | ICD-10-CM

## 2013-07-08 DIAGNOSIS — R059 Cough, unspecified: Secondary | ICD-10-CM | POA: Insufficient documentation

## 2013-07-08 MED ORDER — METHACHOLINE 0.0625 MG/ML NEB SOLN
2.0000 mL | Freq: Once | RESPIRATORY_TRACT | Status: AC
  Administered 2013-07-08: 0.125 mg via RESPIRATORY_TRACT

## 2013-07-08 MED ORDER — ALBUTEROL SULFATE (2.5 MG/3ML) 0.083% IN NEBU
2.5000 mg | INHALATION_SOLUTION | Freq: Once | RESPIRATORY_TRACT | Status: AC
  Administered 2013-07-08: 2.5 mg via RESPIRATORY_TRACT

## 2013-07-08 MED ORDER — METHACHOLINE 16 MG/ML NEB SOLN
2.0000 mL | Freq: Once | RESPIRATORY_TRACT | Status: AC
  Administered 2013-07-08: 32 mg via RESPIRATORY_TRACT

## 2013-07-08 MED ORDER — SODIUM CHLORIDE 0.9 % IN NEBU
3.0000 mL | INHALATION_SOLUTION | Freq: Once | RESPIRATORY_TRACT | Status: AC
  Administered 2013-07-08: 3 mL via RESPIRATORY_TRACT

## 2013-07-08 MED ORDER — METHACHOLINE 1 MG/ML NEB SOLN
2.0000 mL | Freq: Once | RESPIRATORY_TRACT | Status: AC
  Administered 2013-07-08: 2 mg via RESPIRATORY_TRACT

## 2013-07-08 MED ORDER — METHACHOLINE 0.25 MG/ML NEB SOLN
2.0000 mL | Freq: Once | RESPIRATORY_TRACT | Status: AC
  Administered 2013-07-08: 0.5 mg via RESPIRATORY_TRACT

## 2013-07-08 MED ORDER — METHACHOLINE 4 MG/ML NEB SOLN
2.0000 mL | Freq: Once | RESPIRATORY_TRACT | Status: AC
  Administered 2013-07-08: 8 mg via RESPIRATORY_TRACT

## 2013-07-09 ENCOUNTER — Encounter: Payer: Self-pay | Admitting: Internal Medicine

## 2013-07-09 NOTE — Telephone Encounter (Signed)
Patient is requesting MCT (PFT) results. Aware that once these are received and reviewed, we will contact her.   Pt also c/o persistent "barky" cough since the test. Pt states that she has started using her Tylenol #3 every 4 hours to try and help alleviate cough. Pt states that she cannot take the Tylenol #3 while at work and she coughs very badly all day. Pt would like any further recs that can be given.   Please advise Dr Sherene SiresWert. Thanks.

## 2013-07-10 LAB — PULMONARY FUNCTION TEST
FEF 25-75 PRE: 2.7 L/s
FEF 25-75 Post: 2.93 L/sec
FEF2575-%CHANGE-POST: 8 %
FEF2575-%PRED-POST: 97 %
FEF2575-%Pred-Pre: 90 %
FEV1-%CHANGE-POST: 4 %
FEV1-%PRED-POST: 95 %
FEV1-%PRED-PRE: 90 %
FEV1-POST: 3.1 L
FEV1-PRE: 2.96 L
FEV1FVC-%Change-Post: -1 %
FEV1FVC-%PRED-PRE: 98 %
FEV6-%Change-Post: 6 %
FEV6-%PRED-POST: 99 %
FEV6-%Pred-Pre: 93 %
FEV6-Post: 4.01 L
FEV6-Pre: 3.75 L
FEV6FVC-%Change-Post: 0 %
FEV6FVC-%PRED-PRE: 102 %
FEV6FVC-%Pred-Post: 103 %
FVC-%Change-Post: 6 %
FVC-%Pred-Post: 96 %
FVC-%Pred-Pre: 90 %
FVC-Post: 4.01 L
FVC-Pre: 3.77 L
POST FEV1/FVC RATIO: 77 %
PRE FEV6/FVC RATIO: 100 %
Post FEV6/FVC ratio: 100 %
Pre FEV1/FVC ratio: 78 %

## 2013-07-28 ENCOUNTER — Encounter: Payer: Self-pay | Admitting: *Deleted

## 2013-07-28 ENCOUNTER — Encounter: Payer: Self-pay | Admitting: Internal Medicine

## 2013-07-28 ENCOUNTER — Telehealth: Payer: Self-pay | Admitting: Internal Medicine

## 2013-07-28 ENCOUNTER — Ambulatory Visit (INDEPENDENT_AMBULATORY_CARE_PROVIDER_SITE_OTHER): Payer: BC Managed Care – PPO | Admitting: Internal Medicine

## 2013-07-28 VITALS — BP 124/70 | HR 70 | Temp 98.0°F | Ht 69.5 in | Wt 219.0 lb

## 2013-07-28 DIAGNOSIS — R058 Other specified cough: Secondary | ICD-10-CM

## 2013-07-28 DIAGNOSIS — R05 Cough: Secondary | ICD-10-CM

## 2013-07-28 DIAGNOSIS — R059 Cough, unspecified: Secondary | ICD-10-CM

## 2013-07-28 NOTE — Progress Notes (Signed)
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 by Gina Oneill weaned to advair/singulair/albuterol and eval in the 1990's for allergies on shots by ENT x 3 y no benefit and stopped all meds early 2000 and did fine despite smoking with only flares twice yearly since then controlled with prn tussionex no pred no inhalers and then quit smoking 2012 and then got worse with more freq head /chest congestion rx with freq abx by Dr Gina Oneill more persistent nasal and chest congestion/ then more doe starting Jan 2015 when got new outside bike, better on advair initially in Jan then mid februrary 2015 developed desats assoc with R >L CP so went to [facility] 05/10/13 much better p alb/ prednisone.   History of Present Illness  05/19/2013 1st Gina Oneill Pulmonary office visit/ Gina Oneill maint on advair 250 bid  Chief Complaint  Patient presents with  . Pulmonary Consult    Self referral- pt c/o cough, SOB and CP since mid Jan 2015. Pt states that she gets SOB with any exertion and sometimes even with just walking. CP also bothers her "all the time" but worse upon deep inspiration.     cp is diffuse ant > post and only started p the cough which is harsh, barking, upper airway in quality with min mucoid sputum. advair not helping- nothing ever helps including saba  rec If you trouble catching your breath, use dulera 100 up to 2 puffs every 12 hours as needed  Stop advair  Prednisone 10 mg take  4 each am x 2 days,   2 each am x 2 days,  1 each am x 2 days and stop  Pantoprazole (protonix) 40 mg   Take 30-60 min before first meal of the day and Pepcid 20 mg one bedtime until return to office - this is the best way to tell whether stomach acid is contributing to your problem.   GERD  Take delsym two tsp every 12 hours and supplement if needed with  Tylenol #3 one-two every 4  hours to suppress the urge to cough.  Once you have eliminated the cough for 3 straight days try reducing the tylenol #3  First.   06/02/2013 f/u ov/Gina Oneill re: ? Cough variant asthma Chief Complaint  Patient presents with  . Follow-up    Pt reports that she was much better after last visit, but worse x 5 days and back to where she was before.   nasal tone to voice/ green mucus from nose with weather change x 5 d prior to OV   But no sob Stopped all meds prior to exac rec use dulera 100 up to 2 puffs every 12 hours  As a maintenance med Prednisone 10 mg take  4 each am x 2 days,   2 each am x 2 days,  1 each am x 2 days and stop  Continue Pantoprazole (protonix) 40 mg   Take 30-60 min before first meal of the day and Pepcid 20 mg one bedtime   GERD   Take delsym two tsp every 12 hours and supplement if needed with  Tylenol #3 one-two every 4 hours   06/16/2013 f/u ov/Diala Oneill re: completely better off codeine acutely worse x24 h while on dulera 100 and gerd rx Chief Complaint  Patient presents with  .  Follow-up    Pt reports that her cough was almost completely resovled, until 1 day ago started back coughing- non prod. She relates this to the rain and cold weather.    rec Prednisone 10 mg take  4 each am x 2 days,   2 each am x 2 days,  1 each am x 2 days and stop  Add back the singulair 10 mg one daily  Out of work until April 29  schedule sinus ct> neg sinus ct  Every time you cough use the flutter valve to cough into and suppress the cough tylenol #3/delsym   06/30/2013 f/u ov/Gina Oneill re: hacky cough since xmas,  Cp gone able to jog Chief Complaint  Patient presents with  . Follow-up    Cough is unchanged since last visit. No new co's today.   cough is dry, more when awake - sleeping cough is resolved, 30 min p awakening urge to cough recurs, not better with H1 Stopped dulera 2 days prior to OV  And no change as yet Cough 100% resolved while Tylenol # 3 and after stopped May 7thx 2 days  began coughing again May 9th but only 25% as bad as when she was at her worst and no sob. Gina Oneill Oneill Comments  Do you awaken from a sound sleep coughing violently?                            With trouble breathing? no   Do you have choking episodes when you cannot  Get enough air, gasping for air ?              no   Do you usually cough when you lie down into  The bed, or when you just lie down to rest ?                          no   Do you usually cough after meals or eating?         no   Do you cough when (or after) you bend over?    no   GERD SCORE     Gina Oneill Neurogenic   Do you more-or-less cough all day long? no   Does change of temperature make you cough? yes   Does laughing or chuckling cause you to cough? no   Do fumes (perfume, automobile fumes, burned  Toast, etc.,) cause you to cough ?      yes   Does speaking, singing, or talking on the phone cause you to cough   ?               No    Neurogenic/Airway score    rec neurontin 100 mg three times a day with meals and leave off the dulera No other changes for now Schedule asthma challenge test  > neg     07/28/2013 f/u ov/Gina Oneill re: chronic cough/ 100% resolved though only tol neurontin bid  Chief Complaint  Patient presents with  . Follow-up    Cough has resolved. No new co's today.   no need for suppression at all,  off ppi and just taking pepcid at hs   No obvious other patterns in  day to day or daytime variabilty or assoc limiting  sob  chest tightness, subjective wheeze overt sinus or hb symptoms. No unusual exp  hx or h/o childhood pna/ asthma or knowledge of premature birth.  Sleeping ok without nocturnal  or early am exacerbation  of respiratory  c/o's or need for noct saba. Also denies any obvious fluctuation of symptoms with weather or environmental changes or other aggravating or alleviating factors except as outlined above    Current Medications, Allergies, Complete Past Medical History, Past Surgical History, Family History, and Social History were reviewed in [employer] record.  ROS  The following are not active complaints unless bolded sore throat, dysphagia, dental problems, itching, sneezing,  nasal congestion or excess/ purulent secretions, ear ache,   fever, chills, sweats, unintended wt loss, pleuritic or exertional cp, hemoptysis,  orthopnea pnd or leg swelling, presyncope, palpitations, heartburn, abdominal pain, anorexia, nausea, vomiting, diarrhea  or change in bowel or urinary habits, change in stools or urine, dysuria,hematuria,  rash, arthralgias, visual complaints, headache, numbness weakness or ataxia or problems with walking or coordination,  change in mood/affect or memory.        .               Objective:   Physical Exam  amb somber  wf nad  No longer  Hoarse,  no barking cough  06/16/2013        213 >  06/30/2013 217 > 07/28/2013  219  Wt Readings from Last 3 Encounters:  06/02/13 216 lb 12.8 oz (98.34 kg)  05/19/13 219 lb (99.338 kg)  04/12/13 220 lb (99.791 kg)       HEENT: nl dentition, turbinates, and orophanx. Nl external ear canals without cough reflex   NECK :  without JVD/Nodes/TM/ nl carotid upstrokes bilaterally   LUNGS: no acc muscle use, clear to A and P bilaterally without cough on insp or exp maneuvers   CV:  RRR  no s3 or murmur or increase in P2, no edema   ABD:  soft and nontender with nl excursion in the supine position. No bruits or organomegaly, bowel sounds nl  MS:  warm without deformities, calf tenderness, cyanosis or clubbing       cxr 04/12/13  No active cardiopulmonary disease.  cxr repeated 05/14/13 c/w bronchitis       Assessment & Plan:

## 2013-07-28 NOTE — Assessment & Plan Note (Signed)
-   Allergy Rhinitis .06/16/2013 >  IgE 28 pos cat / dust - Singulair/flutter trial 06/16/2013  - CT sinus Mucous retention cyst or polyp in the left maxillary sinus.>>No findings for acute sinusitis. - Neurontin trial at 100 mg tid  06/30/2013 >> improved 07/28/2013  - MCT scheduled 07/08/13 > neg for airflow obst, did make her cough x 24 h    Clearly responsive to neurontin typical of irritable larynx syndrome and not asthma so ok to stop singulair and would continue the neurontin x 3 months total before weaning off and then see if flares.  Also no need for aggressive gerd rx because most likely gerd not the primary but the secondary problem (from gastric pressures related to coughing)   See instructions for specific recommendations which were reviewed directly with the patient who was given a copy with highlighter outlining the key components.

## 2013-07-28 NOTE — Telephone Encounter (Signed)
Per OV 07/28/13; Try off singulair to see what difference this makes - restart if cough recurs  In 2 months ok to try off the neurontin but restart if cough recurs - then you return to 6 night's a week - your regular schedule  Pulmonary follow up is as needed  ---  Called spoke with pt. The letter we wrote for pt this AM that she can't return back to work 09/27/13. She is already working 5 shifts a week. She would like MW to stated in 3 months she can return to work 6 shifts. Please advise Dr. Sherene Sires thanks

## 2013-07-28 NOTE — Telephone Encounter (Signed)
Pt is wanting to p/u letter tomorrow AM. I have palced letter for pick up. Nothing further needed

## 2013-07-28 NOTE — Patient Instructions (Signed)
Try off singulair to see what difference this makes - restart if cough recurs   In 2 months ok to try off the neurontin but restart if cough recurs - then you return to 6 night's a week - your regular schedule  Pulmonary follow up is as needed

## 2013-07-28 NOTE — Telephone Encounter (Signed)
Yes sorry, it should state  it the way she said

## 2016-11-09 ENCOUNTER — Other Ambulatory Visit: Payer: Self-pay | Admitting: Internal Medicine

## 2016-11-09 DIAGNOSIS — R319 Hematuria, unspecified: Secondary | ICD-10-CM

## 2016-11-14 ENCOUNTER — Ambulatory Visit
Admission: RE | Admit: 2016-11-14 | Discharge: 2016-11-14 | Disposition: A | Discharge status: Home / Self Care | Payer: PRIVATE HEALTH INSURANCE | Source: Ambulatory Visit | Attending: Internal Medicine | Admitting: Internal Medicine

## 2016-11-14 DIAGNOSIS — R319 Hematuria, unspecified: Secondary | ICD-10-CM

## 2016-11-14 MED ORDER — IOPAMIDOL (ISOVUE-300) INJECTION 61%
125.0000 mL | Freq: Once | INTRAVENOUS | Status: AC | PRN
  Administered 2016-11-14: 125 mL via INTRAVENOUS

## 2016-11-22 ENCOUNTER — Other Ambulatory Visit: Payer: Self-pay | Admitting: Internal Medicine

## 2016-11-22 DIAGNOSIS — N289 Disorder of kidney and ureter, unspecified: Secondary | ICD-10-CM

## 2016-12-05 ENCOUNTER — Ambulatory Visit
Admission: RE | Admit: 2016-12-05 | Discharge: 2016-12-05 | Disposition: A | Discharge status: Home / Self Care | Payer: Self-pay | Source: Ambulatory Visit | Attending: Internal Medicine | Admitting: Internal Medicine

## 2016-12-05 DIAGNOSIS — N289 Disorder of kidney and ureter, unspecified: Secondary | ICD-10-CM

## 2016-12-05 MED ORDER — GADOBENATE DIMEGLUMINE 529 MG/ML IV SOLN
20.0000 mL | Freq: Once | INTRAVENOUS | Status: AC | PRN
  Administered 2016-12-05: 20 mL via INTRAVENOUS

## 2017-04-27 ENCOUNTER — Ambulatory Visit: Payer: BLUE CROSS/BLUE SHIELD | Admitting: Urology

## 2017-04-27 ENCOUNTER — Encounter: Payer: Self-pay | Admitting: Urology

## 2017-04-27 VITALS — BP 144/80 | HR 67 | Ht 70.0 in | Wt 210.0 lb

## 2017-04-27 DIAGNOSIS — R3129 Other microscopic hematuria: Secondary | ICD-10-CM | POA: Diagnosis not present

## 2017-04-27 DIAGNOSIS — R310 Gross hematuria: Secondary | ICD-10-CM

## 2017-04-27 LAB — MICROSCOPIC EXAMINATION: RBC, UA: >30 /hpf — ABNORMAL HIGH (ref 0–?)

## 2017-04-27 LAB — URINALYSIS, COMPLETE
Bilirubin, UA: NEGATIVE
GLUCOSE, UA: NEGATIVE
KETONES UA: NEGATIVE
Leukocytes, UA: NEGATIVE
NITRITE UA: NEGATIVE
Specific Gravity, UA: 1.025 (ref 1.005–1.030)
Urobilinogen, Ur: 0.2 mg/dL (ref 0.2–1.0)
pH, UA: 5.5 (ref 5.0–7.5)
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
## 2017-04-30 ENCOUNTER — Other Ambulatory Visit: Payer: Self-pay

## 2017-04-30 MED ORDER — PROMETHAZINE HCL 12.5 MG PO TABS: 12.5000 mg | ORAL_TABLET | Freq: Four times a day (QID) | ORAL | 0 refills | Status: DC | PRN

## 2017-05-21 ENCOUNTER — Telehealth: Payer: Self-pay

## 2017-05-21 NOTE — Telephone Encounter (Signed)
Pt called stating she had a MRI in October 2018 and wanted to know if that was sufficient for CT hematuria workup. Pt stated that she has the CT scheduled for next Monday. Please advise.

## 2017-05-22 ENCOUNTER — Telehealth: Payer: Self-pay | Admitting: Urology

## 2017-05-22 ENCOUNTER — Other Ambulatory Visit: Payer: Self-pay

## 2017-05-22 NOTE — Telephone Encounter (Signed)
The MRI and prior CT in September would suffice for the hematuria workup.  I had ordered the CT urogram because she was having significant pain.  If her pain is better then she does not need to have

## 2017-05-22 NOTE — Telephone Encounter (Signed)
Spoke with pt in reference to imaging. Pt stated that she continues to have pain. Encouraged pt to have CT urogram completed. Pt voiced understanding. Cysto appt was scheduled also.

## 2017-05-22 NOTE — Telephone Encounter (Signed)
Pt wanted to know if you could view MRI ordered from another dr.  She is currently having back pain, flank pain, frequency, urgency, nausea, vomiting and wanted to know if she could cancel CT appt and proceed with Cysto, which hasn't currently been set up.  Please advise.

## 2017-05-22 NOTE — Telephone Encounter (Signed)
Nothing seen on MRI or previous CT that would be a urologic cause of her pain.  Okay to schedule cystoscopy.

## 2017-05-28 ENCOUNTER — Telehealth: Payer: Self-pay

## 2017-05-28 ENCOUNTER — Ambulatory Visit
Admission: RE | Admit: 2017-05-28 | Discharge: 2017-05-28 | Disposition: A | Discharge status: Home / Self Care | Payer: BLUE CROSS/BLUE SHIELD | Source: Ambulatory Visit | Attending: Urology | Admitting: Urology

## 2017-05-28 DIAGNOSIS — R3129 Other microscopic hematuria: Secondary | ICD-10-CM | POA: Diagnosis present

## 2017-05-28 DIAGNOSIS — M4807 Spinal stenosis, lumbosacral region: Secondary | ICD-10-CM | POA: Insufficient documentation

## 2017-05-28 DIAGNOSIS — N281 Cyst of kidney, acquired: Secondary | ICD-10-CM | POA: Diagnosis not present

## 2017-05-28 DIAGNOSIS — N132 Hydronephrosis with renal and ureteral calculous obstruction: Secondary | ICD-10-CM | POA: Insufficient documentation

## 2017-05-28 DIAGNOSIS — R310 Gross hematuria: Secondary | ICD-10-CM

## 2017-05-28 DIAGNOSIS — M48061 Spinal stenosis, lumbar region without neurogenic claudication: Secondary | ICD-10-CM | POA: Diagnosis not present

## 2017-05-28 LAB — POCT I-STAT CREATININE: CREATININE: 0.9 mg/dL (ref 0.44–1.00)

## 2017-05-28 MED ORDER — IOPAMIDOL (ISOVUE-300) INJECTION 61%
125.0000 mL | Freq: Once | INTRAVENOUS | Status: AC | PRN
  Administered 2017-05-28: 125 mL via INTRAVENOUS

## 2017-05-28 NOTE — Telephone Encounter (Signed)
Pt called stating she was returning Dr. Heywood FootmanStoioff's call. Did not see a note in the chart. Cysto has already been scheduled.

## 2017-05-30 NOTE — Telephone Encounter (Signed)
Patient called stating she was returning your call She said she works 3rd shift and was going home and going to bed. She get's up around 4:30 you can call and leave a message on her voicemail with her results or if you want to talk to her call after that time.  Gina DusterMichelle

## 2017-05-31 ENCOUNTER — Telehealth: Payer: Self-pay | Admitting: Radiology

## 2017-05-31 NOTE — Telephone Encounter (Signed)
Pt called to schedule surgery in May. Will need orders. Also, does pt need to keep appt for cysto on 06/13/2017? Please advise.

## 2017-05-31 NOTE — Telephone Encounter (Signed)
I discussed her CT findings and migration of her stone to the left renal pelvis.  Treatment options were discussed including shockwave lithotripsy and ureteroscopy.  She would like to schedule ureteroscopy.  Follow-up for a preop visit.

## 2017-05-31 NOTE — Telephone Encounter (Signed)
The orders are on your desk.  She does not need a cystoscopy however does need a preop appointment with me to discuss the surgery.

## 2017-06-06 ENCOUNTER — Other Ambulatory Visit (INDEPENDENT_AMBULATORY_CARE_PROVIDER_SITE_OTHER): Payer: Self-pay | Admitting: Internal Medicine

## 2017-06-06 NOTE — Telephone Encounter (Signed)
Appointment to discuss surgery with Dr Lonna CobbStoioff made on 07/04/2017 per pt request.

## 2017-06-11 ENCOUNTER — Other Ambulatory Visit: Payer: Self-pay | Admitting: Radiology

## 2017-06-11 DIAGNOSIS — N200 Calculus of kidney: Secondary | ICD-10-CM

## 2017-06-13 ENCOUNTER — Other Ambulatory Visit: Payer: Self-pay | Admitting: Radiology

## 2017-06-13 ENCOUNTER — Other Ambulatory Visit: Payer: Self-pay | Admitting: Urology

## 2017-06-13 DIAGNOSIS — N200 Calculus of kidney: Secondary | ICD-10-CM

## 2017-06-20 ENCOUNTER — Ambulatory Visit: Payer: BLUE CROSS/BLUE SHIELD | Admitting: Urology

## 2017-07-04 ENCOUNTER — Other Ambulatory Visit: Payer: Self-pay

## 2017-07-04 ENCOUNTER — Ambulatory Visit: Payer: BLUE CROSS/BLUE SHIELD | Admitting: Urology

## 2017-07-04 ENCOUNTER — Encounter: Payer: Self-pay | Admitting: Urology

## 2017-07-04 ENCOUNTER — Other Ambulatory Visit: Payer: Self-pay | Admitting: Radiology

## 2017-07-04 ENCOUNTER — Encounter
Admission: RE | Admit: 2017-07-04 | Discharge: 2017-07-04 | Disposition: A | Discharge status: Home / Self Care | Payer: BLUE CROSS/BLUE SHIELD | Source: Ambulatory Visit | Attending: Urology | Admitting: Urology

## 2017-07-04 VITALS — BP 144/84 | HR 76 | Resp 16 | Ht 69.0 in | Wt 213.0 lb

## 2017-07-04 DIAGNOSIS — E119 Type 2 diabetes mellitus without complications: Secondary | ICD-10-CM | POA: Insufficient documentation

## 2017-07-04 DIAGNOSIS — Z01810 Encounter for preprocedural cardiovascular examination: Secondary | ICD-10-CM | POA: Insufficient documentation

## 2017-07-04 DIAGNOSIS — Z01812 Encounter for preprocedural laboratory examination: Secondary | ICD-10-CM | POA: Insufficient documentation

## 2017-07-04 DIAGNOSIS — R001 Bradycardia, unspecified: Secondary | ICD-10-CM | POA: Diagnosis not present

## 2017-07-04 DIAGNOSIS — N200 Calculus of kidney: Secondary | ICD-10-CM | POA: Diagnosis not present

## 2017-07-04 HISTORY — DX: Depression, unspecified: F32.A

## 2017-07-04 HISTORY — DX: Personal history of urinary calculi: Z87.442

## 2017-07-04 HISTORY — DX: Major depressive disorder, single episode, unspecified: F32.9

## 2017-07-04 LAB — BASIC METABOLIC PANEL
Anion gap: 9 (ref 5–15)
BUN: 12 mg/dL (ref 6–20)
CALCIUM: 8.9 mg/dL (ref 8.9–10.3)
CO2: 26 mmol/L (ref 22–32)
Chloride: 104 mmol/L (ref 101–111)
Creatinine, Ser: 0.96 mg/dL (ref 0.44–1.00)
GLUCOSE: 114 mg/dL — AB (ref 65–99)
POTASSIUM: 4.1 mmol/L (ref 3.5–5.1)
SODIUM: 139 mmol/L (ref 135–145)

## 2017-07-04 LAB — CBC
HCT: 41.4 % (ref 35.0–47.0)
Hemoglobin: 13.9 g/dL (ref 12.0–16.0)
MCH: 29.4 pg (ref 26.0–34.0)
MCHC: 33.6 g/dL (ref 32.0–36.0)
MCV: 87.4 fL (ref 80.0–100.0)
PLATELETS: 260 10*3/uL (ref 150–440)
RBC: 4.73 MIL/uL (ref 3.80–5.20)
RDW: 13.7 % (ref 11.5–14.5)
WBC: 13 10*3/uL — AB (ref 3.6–11.0)

## 2017-07-04 LAB — URINALYSIS, COMPLETE
Bilirubin, UA: NEGATIVE
Glucose, UA: NEGATIVE
KETONES UA: NEGATIVE
Leukocytes, UA: NEGATIVE
NITRITE UA: NEGATIVE
Protein, UA: NEGATIVE
Specific Gravity, UA: 1.02 (ref 1.005–1.030)
Urobilinogen, Ur: 0.2 mg/dL (ref 0.2–1.0)
pH, UA: 5 (ref 5.0–7.5)

## 2017-07-04 LAB — MICROSCOPIC EXAMINATION
Bacteria, UA: NONE SEEN
EPITHELIAL CELLS (NON RENAL): NONE SEEN /HPF (ref 0–10)
WBC UA: NONE SEEN /HPF (ref 0–5)

## 2017-07-04 NOTE — Progress Notes (Signed)
07/04/2017 8:43 AM   Rayfield Citizen 07-08-1960 [id number]  Referring provider: Renford Dills, MD [address] [place], [place] [address]  Chief Complaint  Patient presents with  . Pre-op Exam    HPI: Kellye Mizner is a 57 year old female with a 10 mm left renal pelvic calculus and intermittent symptoms of flank pain, nausea and vomiting.  After discussing management options she is scheduled for left ureteroscopy next week.   She denies fever or chills.  PMH: Past Medical History:  Diagnosis Date  . Asthma   . Diabetes mellitus without complication (HCC)   . GERD (gastroesophageal reflux disease)   . Kidney stones     Surgical History: Past Surgical History:  Procedure Laterality Date  . ABDOMINAL HYSTERECTOMY    . APPENDECTOMY    . CHOLECYSTECTOMY    . OSTEOTOMY MANDIBULAR      Home Medications:  Allergies as of 07/04/2017      Reactions   Penicillins Anaphylaxis   Has patient had a PCN reaction causing immediate rash, facial/tongue/throat swelling, SOB or lightheadedness with hypotension: YES Has patient had a PCN reaction causing severe rash involving mucus membranes or skin necrosis: NO Has patient had a PCN reaction that required hospitalization: YES Has patient had a PCN reaction occurring within the last 10 years: NO If all of the above answers are "NO", then may proceed with Cephalosporin use.   Erythromycin Itching   Sulfa Antibiotics Itching   Azithromycin Itching      Medication List        Accurate as of 07/04/17  8:43 AM. Always use your most recent med list.          Aspirin-Caffeine 845-65 MG Pack Take 1 packet by mouth 2 (two) times daily as needed (PAIN (BC POWDER)).   FLUoxetine 40 MG capsule Commonly known as:  PROZAC Take 50 mg by mouth daily. PT HAS 10 MG AND 40 MG   metFORMIN 1000 MG tablet Commonly known as:  GLUCOPHAGE Take 1,000 mg by mouth 2 (two) times daily with a meal.   methocarbamol 500 MG tablet Commonly  known as:  ROBAXIN Take 500 mg by mouth every 8 (eight) hours as needed for muscle spasms.   promethazine 12.5 MG tablet Commonly known as:  PHENERGAN Take 1 tablet (12.5 mg total) by mouth every 6 (six) hours as needed for nausea or vomiting.       Allergies:  Allergies  Allergen Reactions  . Penicillins Anaphylaxis    Has patient had a PCN reaction causing immediate rash, facial/tongue/throat swelling, SOB or lightheadedness with hypotension: YES Has patient had a PCN reaction causing severe rash involving mucus membranes or skin necrosis: NO Has patient had a PCN reaction that required hospitalization: YES Has patient had a PCN reaction occurring within the last 10 years: NO If all of the above answers are "NO", then may proceed with Cephalosporin use.   . Erythromycin Itching  . Sulfa Antibiotics Itching  . Azithromycin Itching    Family History: Family History  Problem Relation Age of Onset  . Allergies Mother   . Heart disease Mother   . Breast cancer Mother   . Allergies Father   . Allergies Sister   . Heart disease Maternal Grandmother   . Colon cancer Maternal Grandmother   . Colon cancer Paternal Grandmother     Social History:  reports that she quit smoking about 17 years ago. Her smoking use included cigarettes. She has a 7.50 pack-year  smoking history. She has never used smokeless tobacco. She reports that she does not drink alcohol or use drugs.  ROS: UROLOGY Frequent Urination?: Yes Hard to postpone urination?: No Burning/pain with urination?: Yes Get up at night to urinate?: Yes Leakage of urine?: No Urine stream starts and stops?: No Trouble starting stream?: No Do you have to strain to urinate?: No Blood in urine?: Yes Urinary tract infection?: No Sexually transmitted disease?: No Injury to kidneys or bladder?: Yes Painful intercourse?: No Weak stream?: No Currently pregnant?: No Vaginal bleeding?: No  Gastrointestinal Nausea?:  Yes Vomiting?: Yes Indigestion/heartburn?: No Diarrhea?: No Constipation?: No  Constitutional Fever: Yes Night sweats?: No Weight loss?: No Fatigue?: Yes  Skin Skin rash/lesions?: No Itching?: Yes  Eyes Blurred vision?: No Double vision?: No  Ears/Nose/Throat Sore throat?: No Sinus problems?: Yes  Hematologic/Lymphatic Swollen glands?: No Easy bruising?: No  Cardiovascular Leg swelling?: No Chest pain?: No  Respiratory Cough?: No Shortness of breath?: No  Endocrine Excessive thirst?: No  Musculoskeletal Back pain?: Yes Joint pain?: No  Neurological Headaches?: No Dizziness?: No  Psychologic Depression?: Yes Anxiety?: No  Physical Exam: BP (!) 144/84   Pulse 76   Resp 16   Ht  (1.753 m)   Wt 213 lb (96.6 kg)   SpO2 96%   BMI 31.45 kg/m   Constitutional:  Alert and oriented, No acute distress. HEENT:  AT, moist mucus membranes.  Trachea midline, no masses. Cardiovascular: No clubbing, cyanosis, or edema.  RRR Respiratory: Normal respiratory effort, no increased work of breathing.  Clear GI: Abdomen is soft, nontender, nondistended, no abdominal masses GU: No CVA tenderness Lymph: No cervical or inguinal lymphadenopathy. Skin: No rashes, bruises or suspicious lesions. Neurologic: Grossly intact, no focal deficits, moving all 4 extremities. Psychiatric: Normal mood and affect.   Pertinent Imaging: Images personally reviewed Results for orders placed during the hospital encounter of 05/28/17  CT HEMATURIA WORKUP   Narrative CLINICAL DATA:  Gross and microhematuria with back pain since June of 2018.  EXAM: CT ABDOMEN AND PELVIS WITHOUT AND WITH CONTRAST  TECHNIQUE: Multidetector CT imaging of the abdomen and pelvis was performed following the standard protocol before and following the bolus administration of intravenous contrast.  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 No sequestration is observed.  Hepatobiliary: Diffuse hepatic steatosis. Cholecystectomy. No focal hepatic lesion identified.  Pancreas: Unremarkable  Spleen: Unremarkable  Adrenals/Urinary Tract: The adrenal glands appear normal.  There is mild left hydronephrosis associated with a 1.0 by 0.4 by 0.5 cm stone at the left UPJ extending towards the proximal ureter. There is mild wall thickening of the UPJ and proximal ureter in the vicinity of this stone likely from local irritation. No other urinary tract calculi are observed.  1.3 by 1.1 cm cyst in the right kidney lower pole. Several additional small right hypodense lesions are technically too small to characterize although statistically likely to be benign.  A 1.1 by 0.8 cm lesion from the left kidney lower pole on image 96/5 is mildly hyperdense compared to the renal parenchyma, without appreciable enhancement, compatible with a Bosniak category 2 cyst.  Stomach/Bowel: Unremarkable  Vascular/Lymphatic: Anomalous vessel discussed in the chest  section above. Otherwise unremarkable. Small retroperitoneal lymph nodes are not pathologically enlarged.  Reproductive: Uterus absent.  Adnexa unremarkable.  Other: No supplemental non-categorized findings.  Musculoskeletal: Mild left foraminal stenosis at L4-5 and L5-S1 due to prominent superior articular facets at the L5-S1 levels, respectively.  IMPRESSION: 1. 10 by 4 by 5 mm stone at the left UPJ associated with mild left hydronephrosis and low-level enhancement and wall thickening in the vicinity of the stone likely related to inflammation. No other stones  identified. 2. 1.1 cm in long axis Bosniak category 2 cyst of the left kidney lower pole. 3. 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 This rare form of anomalous systemic arterial supply to the lung can be asymptomatic but can sometimes lead to localized pulmonary hypertension, hemoptysis, and cardiac failure. Thoracic surgical and/or pulmonary medicine consultation is recommended. 4. Mild left foraminal stenosis at L4-5 and L5-S1 due to facet spurring.   Electronically Signed   By: Gaylyn Rong M.D.   On: 05/28/2017 12:15     Assessment & Plan:   57 year old female with a left renal pelvic calculus and mild hydronephrosis.  She is scheduled for left ureteroscopic stone removal next week.  The indications and nature of the planned procedure were discussed as well as the potential  benefits and expected outcome.  Alternatives have been discussed in detail. The most common complications and side effects were discussed including but not limited to infection/sepsis; blood loss; damage to urethra, bladder, ureter, kidney; need for multiple surgeries; need for prolonged stent placement as well as general anesthesia risks.  Although uncommon she was also informed of the possibility that the calculus may not be able to be treated due to inability to obtain access to the upper ureter. In that event she would require stent placement and a follow-up procedure after a period of stent dilation. All of her questions were answered and she desires to proceed.  At the time of our visit she did not appear to be distracted or in pain.    Riki Altes, MD  [facility] [address] [place], [place] [address] [phone]

## 2017-07-04 NOTE — H&P (View-Only) (Signed)
 07/04/2017 8:43 AM   Gina Oneill 11/21/1960 [id number]  Referring provider: Polite, Ronald, MD [address] [place], [place] [address]  Chief Complaint  Patient presents with  . Pre-op Exam    HPI: Gina Oneill is a 57-year-old female with a 10 mm left renal pelvic calculus and intermittent symptoms of flank pain, nausea and vomiting.  After discussing management options she is scheduled for left ureteroscopy next week.   She denies fever or chills.  PMH: Past Medical History:  Diagnosis Date  . Asthma   . Diabetes mellitus without complication (HCC)   . GERD (gastroesophageal reflux disease)   . Kidney stones     Surgical History: Past Surgical History:  Procedure Laterality Date  . ABDOMINAL HYSTERECTOMY    . APPENDECTOMY    . CHOLECYSTECTOMY    . OSTEOTOMY MANDIBULAR      Home Medications:  Allergies as of 07/04/2017      Reactions   Penicillins Anaphylaxis   Has patient had a PCN reaction causing immediate rash, facial/tongue/throat swelling, SOB or lightheadedness with hypotension: YES Has patient had a PCN reaction causing severe rash involving mucus membranes or skin necrosis: NO Has patient had a PCN reaction that required hospitalization: YES Has patient had a PCN reaction occurring within the last 10 years: NO If all of the above answers are "NO", then may proceed with Cephalosporin use.   Erythromycin Itching   Sulfa Antibiotics Itching   Azithromycin Itching      Medication List        Accurate as of 07/04/17  8:43 AM. Always use your most recent med list.          Aspirin-Caffeine 845-65 MG Pack Take 1 packet by mouth 2 (two) times daily as needed (PAIN (BC POWDER)).   FLUoxetine 40 MG capsule Commonly known as:  PROZAC Take 50 mg by mouth daily. PT HAS 10 MG AND 40 MG   metFORMIN 1000 MG tablet Commonly known as:  GLUCOPHAGE Take 1,000 mg by mouth 2 (two) times daily with a meal.   methocarbamol 500 MG tablet Commonly  known as:  ROBAXIN Take 500 mg by mouth every 8 (eight) hours as needed for muscle spasms.   promethazine 12.5 MG tablet Commonly known as:  PHENERGAN Take 1 tablet (12.5 mg total) by mouth every 6 (six) hours as needed for nausea or vomiting.       Allergies:  Allergies  Allergen Reactions  . Penicillins Anaphylaxis    Has patient had a PCN reaction causing immediate rash, facial/tongue/throat swelling, SOB or lightheadedness with hypotension: YES Has patient had a PCN reaction causing severe rash involving mucus membranes or skin necrosis: NO Has patient had a PCN reaction that required hospitalization: YES Has patient had a PCN reaction occurring within the last 10 years: NO If all of the above answers are "NO", then may proceed with Cephalosporin use.   . Erythromycin Itching  . Sulfa Antibiotics Itching  . Azithromycin Itching    Family History: Family History  Problem Relation Age of Onset  . Allergies Mother   . Heart disease Mother   . Breast cancer Mother   . Allergies Father   . Allergies Sister   . Heart disease Maternal Grandmother   . Colon cancer Maternal Grandmother   . Colon cancer Paternal Grandmother     Social History:  reports that she quit smoking about 17 years ago. Her smoking use included cigarettes. She has a 7.50 pack-year   smoking history. She has never used smokeless tobacco. She reports that she does not drink alcohol or use drugs.  ROS: UROLOGY Frequent Urination?: Yes Hard to postpone urination?: No Burning/pain with urination?: Yes Get up at night to urinate?: Yes Leakage of urine?: No Urine stream starts and stops?: No Trouble starting stream?: No Do you have to strain to urinate?: No Blood in urine?: Yes Urinary tract infection?: No Sexually transmitted disease?: No Injury to kidneys or bladder?: Yes Painful intercourse?: No Weak stream?: No Currently pregnant?: No Vaginal bleeding?: No  Gastrointestinal Nausea?:  Yes Vomiting?: Yes Indigestion/heartburn?: No Diarrhea?: No Constipation?: No  Constitutional Fever: Yes Night sweats?: No Weight loss?: No Fatigue?: Yes  Skin Skin rash/lesions?: No Itching?: Yes  Eyes Blurred vision?: No Double vision?: No  Ears/Nose/Throat Sore throat?: No Sinus problems?: Yes  Hematologic/Lymphatic Swollen glands?: No Easy bruising?: No  Cardiovascular Leg swelling?: No Chest pain?: No  Respiratory Cough?: No Shortness of breath?: No  Endocrine Excessive thirst?: No  Musculoskeletal Back pain?: Yes Joint pain?: No  Neurological Headaches?: No Dizziness?: No  Psychologic Depression?: Yes Anxiety?: No  Physical Exam: BP (!) 144/84   Pulse 76   Resp 16   Ht 5' 9" (1.753 m)   Wt 213 lb (96.6 kg)   SpO2 96%   BMI 31.45 kg/m   Constitutional:  Alert and oriented, No acute distress. HEENT: [employer] AT, moist mucus membranes.  Trachea midline, no masses. Cardiovascular: No clubbing, cyanosis, or edema.  RRR Respiratory: Normal respiratory effort, no increased work of breathing.  Clear GI: Abdomen is soft, nontender, nondistended, no abdominal masses GU: No CVA tenderness Lymph: No cervical or inguinal lymphadenopathy. Skin: No rashes, bruises or suspicious lesions. Neurologic: Grossly intact, no focal deficits, moving all 4 extremities. Psychiatric: Normal mood and affect.   Pertinent Imaging: Images personally reviewed Results for orders placed during the hospital encounter of 05/28/17  CT HEMATURIA WORKUP   Narrative CLINICAL DATA:  Gross and microhematuria with back pain since June of 2018.  EXAM: CT ABDOMEN AND PELVIS WITHOUT AND WITH CONTRAST  TECHNIQUE: Multidetector CT imaging of the abdomen and pelvis was performed following the standard protocol before and following the bolus administration of intravenous contrast.  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 No sequestration is observed.  Hepatobiliary: Diffuse hepatic steatosis. Cholecystectomy. No focal hepatic lesion identified.  Pancreas: Unremarkable  Spleen: Unremarkable  Adrenals/Urinary Tract: The adrenal glands appear normal.  There is mild left hydronephrosis associated with a 1.0 by 0.4 by 0.5 cm stone at the left UPJ extending towards the proximal ureter. There is mild wall thickening of the UPJ and proximal ureter in the vicinity of this stone likely from local irritation. No other urinary tract calculi are observed.  1.3 by 1.1 cm cyst in the right kidney lower pole. Several additional small right hypodense lesions are technically too small to characterize although statistically likely to be benign.  A 1.1 by 0.8 cm lesion from the left kidney lower pole on image 96/5 is mildly hyperdense compared to the renal parenchyma, without appreciable enhancement, compatible with a Bosniak category 2 cyst.  Stomach/Bowel: Unremarkable  Vascular/Lymphatic: Anomalous vessel discussed in the chest   section above. Otherwise unremarkable. Small retroperitoneal lymph nodes are not pathologically enlarged.  Reproductive: Uterus absent.  Adnexa unremarkable.  Other: No supplemental non-categorized findings.  Musculoskeletal: Mild left foraminal stenosis at L4-5 and L5-S1 due to prominent superior articular facets at the L5-S1 levels, respectively.  IMPRESSION: 1. 10 by 4 by 5 mm stone at the left UPJ associated with mild left hydronephrosis and low-level enhancement and wall thickening in the vicinity of the stone likely related to inflammation. No other stones  identified. 2. 1.1 cm in long axis Bosniak category 2 cyst of the left kidney lower pole. 3. 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 This rare form of anomalous systemic arterial supply to the lung can be asymptomatic but can sometimes lead to localized pulmonary hypertension, hemoptysis, and cardiac failure. Thoracic surgical and/or pulmonary medicine consultation is recommended. 4. Mild left foraminal stenosis at L4-5 and L5-S1 due to facet spurring.   Electronically Signed   By: Walter  Liebkemann M.D.   On: 05/28/2017 12:15     Assessment & Plan:   56-year-old female with a left renal pelvic calculus and mild hydronephrosis.  She is scheduled for left ureteroscopic stone removal next week.  The indications and nature of the planned procedure were discussed as well as the potential  benefits and expected outcome.  Alternatives have been discussed in detail. The most common complications and side effects were discussed including but not limited to infection/sepsis; blood loss; damage to urethra, bladder, ureter, kidney; need for multiple surgeries; need for prolonged stent placement as well as general anesthesia risks.  Although uncommon she was also informed of the possibility that the calculus may not be able to be treated due to inability to obtain access to the upper ureter. In that event she would require stent placement and a follow-up procedure after a period of stent dilation. All of her questions were answered and she desires to proceed.  At the time of our visit she did not appear to be distracted or in pain.    Desera Graffeo C Jamyson Jirak, MD  [facility] [address], [address] [place], [place] [address] [phone]  

## 2017-07-04 NOTE — H&P (View-Only) (Signed)
 07/04/2017 8:43 AM   Gina Oneill 04/05/1960 [id number]  Referring provider: Polite, Ronald, MD [address] [place], [place] [address]  Chief Complaint  Patient presents with  . Pre-op Exam    HPI: Gina Oneill is a 57-year-old female with a 10 mm left renal pelvic calculus and intermittent symptoms of flank pain, nausea and vomiting.  After discussing management options she is scheduled for left ureteroscopy next week.   She denies fever or chills.  PMH: Past Medical History:  Diagnosis Date  . Asthma   . Diabetes mellitus without complication (HCC)   . GERD (gastroesophageal reflux disease)   . Kidney stones     Surgical History: Past Surgical History:  Procedure Laterality Date  . ABDOMINAL HYSTERECTOMY    . APPENDECTOMY    . CHOLECYSTECTOMY    . OSTEOTOMY MANDIBULAR      Home Medications:  Allergies as of 07/04/2017      Reactions   Penicillins Anaphylaxis   Has patient had a PCN reaction causing immediate rash, facial/tongue/throat swelling, SOB or lightheadedness with hypotension: YES Has patient had a PCN reaction causing severe rash involving mucus membranes or skin necrosis: NO Has patient had a PCN reaction that required hospitalization: YES Has patient had a PCN reaction occurring within the last 10 years: NO If all of the above answers are "NO", then may proceed with Cephalosporin use.   Erythromycin Itching   Sulfa Antibiotics Itching   Azithromycin Itching      Medication List        Accurate as of 07/04/17  8:43 AM. Always use your most recent med list.          Aspirin-Caffeine 845-65 MG Pack Take 1 packet by mouth 2 (two) times daily as needed (PAIN (BC POWDER)).   FLUoxetine 40 MG capsule Commonly known as:  PROZAC Take 50 mg by mouth daily. PT HAS 10 MG AND 40 MG   metFORMIN 1000 MG tablet Commonly known as:  GLUCOPHAGE Take 1,000 mg by mouth 2 (two) times daily with a meal.   methocarbamol 500 MG tablet Commonly  known as:  ROBAXIN Take 500 mg by mouth every 8 (eight) hours as needed for muscle spasms.   promethazine 12.5 MG tablet Commonly known as:  PHENERGAN Take 1 tablet (12.5 mg total) by mouth every 6 (six) hours as needed for nausea or vomiting.       Allergies:  Allergies  Allergen Reactions  . Penicillins Anaphylaxis    Has patient had a PCN reaction causing immediate rash, facial/tongue/throat swelling, SOB or lightheadedness with hypotension: YES Has patient had a PCN reaction causing severe rash involving mucus membranes or skin necrosis: NO Has patient had a PCN reaction that required hospitalization: YES Has patient had a PCN reaction occurring within the last 10 years: NO If all of the above answers are "NO", then may proceed with Cephalosporin use.   . Erythromycin Itching  . Sulfa Antibiotics Itching  . Azithromycin Itching    Family History: Family History  Problem Relation Age of Onset  . Allergies Mother   . Heart disease Mother   . Breast cancer Mother   . Allergies Father   . Allergies Sister   . Heart disease Maternal Grandmother   . Colon cancer Maternal Grandmother   . Colon cancer Paternal Grandmother     Social History:  reports that she quit smoking about 17 years ago. Her smoking use included cigarettes. She has a 7.50 pack-year   smoking history. She has never used smokeless tobacco. She reports that she does not drink alcohol or use drugs.  ROS: UROLOGY Frequent Urination?: Yes Hard to postpone urination?: No Burning/pain with urination?: Yes Get up at night to urinate?: Yes Leakage of urine?: No Urine stream starts and stops?: No Trouble starting stream?: No Do you have to strain to urinate?: No Blood in urine?: Yes Urinary tract infection?: No Sexually transmitted disease?: No Injury to kidneys or bladder?: Yes Painful intercourse?: No Weak stream?: No Currently pregnant?: No Vaginal bleeding?: No  Gastrointestinal Nausea?:  Yes Vomiting?: Yes Indigestion/heartburn?: No Diarrhea?: No Constipation?: No  Constitutional Fever: Yes Night sweats?: No Weight loss?: No Fatigue?: Yes  Skin Skin rash/lesions?: No Itching?: Yes  Eyes Blurred vision?: No Double vision?: No  Ears/Nose/Throat Sore throat?: No Sinus problems?: Yes  Hematologic/Lymphatic Swollen glands?: No Easy bruising?: No  Cardiovascular Leg swelling?: No Chest pain?: No  Respiratory Cough?: No Shortness of breath?: No  Endocrine Excessive thirst?: No  Musculoskeletal Back pain?: Yes Joint pain?: No  Neurological Headaches?: No Dizziness?: No  Psychologic Depression?: Yes Anxiety?: No  Physical Exam: BP (!) 144/84   Pulse 76   Resp 16   Ht 5' 9" (1.753 m)   Wt 213 lb (96.6 kg)   SpO2 96%   BMI 31.45 kg/m   Constitutional:  Alert and oriented, No acute distress. HEENT: [place] AT, moist mucus membranes.  Trachea midline, no masses. Cardiovascular: No clubbing, cyanosis, or edema.  RRR Respiratory: Normal respiratory effort, no increased work of breathing.  Clear GI: Abdomen is soft, nontender, nondistended, no abdominal masses GU: No CVA tenderness Lymph: No cervical or inguinal lymphadenopathy. Skin: No rashes, bruises or suspicious lesions. Neurologic: Grossly intact, no focal deficits, moving all 4 extremities. Psychiatric: Normal mood and affect.   Pertinent Imaging: Images personally reviewed Results for orders placed during the hospital encounter of 05/28/17  CT HEMATURIA WORKUP   Narrative CLINICAL DATA:  Gross and microhematuria with back pain since June of 2018.  EXAM: CT ABDOMEN AND PELVIS WITHOUT AND WITH CONTRAST  TECHNIQUE: Multidetector CT imaging of the abdomen and pelvis was performed following the standard protocol before and following the bolus administration of intravenous contrast.  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 No sequestration is observed.  Hepatobiliary: Diffuse hepatic steatosis. Cholecystectomy. No focal hepatic lesion identified.  Pancreas: Unremarkable  Spleen: Unremarkable  Adrenals/Urinary Tract: The adrenal glands appear normal.  There is mild left hydronephrosis associated with a 1.0 by 0.4 by 0.5 cm stone at the left UPJ extending towards the proximal ureter. There is mild wall thickening of the UPJ and proximal ureter in the vicinity of this stone likely from local irritation. No other urinary tract calculi are observed.  1.3 by 1.1 cm cyst in the right kidney lower pole. Several additional small right hypodense lesions are technically too small to characterize although statistically likely to be benign.  A 1.1 by 0.8 cm lesion from the left kidney lower pole on image 96/5 is mildly hyperdense compared to the renal parenchyma, without appreciable enhancement, compatible with a Bosniak category 2 cyst.  Stomach/Bowel: Unremarkable  Vascular/Lymphatic: Anomalous vessel discussed in the chest   section above. Otherwise unremarkable. Small retroperitoneal lymph nodes are not pathologically enlarged.  Reproductive: Uterus absent.  Adnexa unremarkable.  Other: No supplemental non-categorized findings.  Musculoskeletal: Mild left foraminal stenosis at L4-5 and L5-S1 due to prominent superior articular facets at the L5-S1 levels, respectively.  IMPRESSION: 1. 10 by 4 by 5 mm stone at the left UPJ associated with mild left hydronephrosis and low-level enhancement and wall thickening in the vicinity of the stone likely related to inflammation. No other stones  identified. 2. 1.1 cm in long axis Bosniak category 2 cyst of the left kidney lower pole. 3. 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 This rare form of anomalous systemic arterial supply to the lung can be asymptomatic but can sometimes lead to localized pulmonary hypertension, hemoptysis, and cardiac failure. Thoracic surgical and/or pulmonary medicine consultation is recommended. 4. Mild left foraminal stenosis at L4-5 and L5-S1 due to facet spurring.   Electronically Signed   By: Walter  Liebkemann M.D.   On: 05/28/2017 12:15     Assessment & Plan:   57-year-old female with a left renal pelvic calculus and mild hydronephrosis.  She is scheduled for left ureteroscopic stone removal next week.  The indications and nature of the planned procedure were discussed as well as the potential  benefits and expected outcome.  Alternatives have been discussed in detail. The most common complications and side effects were discussed including but not limited to infection/sepsis; blood loss; damage to urethra, bladder, ureter, kidney; need for multiple surgeries; need for prolonged stent placement as well as general anesthesia risks.  Although uncommon she was also informed of the possibility that the calculus may not be able to be treated due to inability to obtain access to the upper ureter. In that event she would require stent placement and a follow-up procedure after a period of stent dilation. All of her questions were answered and she desires to proceed.  At the time of our visit she did not appear to be distracted or in pain.    Scott C Stoioff, MD  [facility] [address], [address] [place], [place] [address] [phone]  

## 2017-07-04 NOTE — Patient Instructions (Signed)
Your procedure is scheduled on: Jul 10, 2017 Tuesday  Report to Day Surgery on the 2nd floor of the [facility].  To find out your arrival time, please call [phone] between 1PM - 3PM on: Jul 09, 2017   REMEMBER: Instructions that are not followed completely may result in serious medical risk, up to and including death; or upon the discretion of your surgeon and anesthesiologist your surgery may need to be rescheduled.  Do not eat food after midnight the night before your procedure.  No gum chewing, lozengers or hard candies.  You may however, drink CLEAR liquids up to 2 hours before you are scheduled to arrive for your surgery. Do not drink anything within 2 hours of the start of your surgery.  Clear liquids include: - water   Do NOT drink anything that is not on this list.  Type 1 and Type 2 diabetics should only drink water.  No Alcohol for 24 hours before or after surgery.  No Smoking including e-cigarettes for 24 hours prior to surgery.  No chewable tobacco products for at least 6 hours prior to surgery.  No nicotine patches on the day of surgery.  On the morning of surgery brush your teeth with toothpaste and water, you may rinse your mouth with mouthwash if you wish. Do not swallow any toothpaste or mouthwash.  Notify your doctor if there is any change in your medical condition (cold, fever, infection).  Do not wear jewelry, make-up, hairpins, clips or nail polish.  Do not wear lotions, powders, or perfumes. You may wear deodorant.  Do not shave 48 hours prior to surgery. Men may shave face and neck.  Contacts and dentures may not be worn into surgery.  Do not bring valuables to the hospital, including drivers license, insurance or credit cards.  [facility] is not responsible for any belongings or valuables.   TAKE THESE MEDICATIONS THE MORNING OF SURGERY: PROZAC ROBAXIN IF NEEDED  Stop Metformin  2 days prior to surgery.   Stop Anti-inflammatories  (NSAIDS) such as Advil, Aleve, Ibuprofen, Motrin, Naproxen, Naprosyn and Aspirin based products such as Excedrin, Goodys Powder, BC Powder. (May take Tylenol or Acetaminophen if needed.)  Stop ANY OVER THE COUNTER supplements until after surgery. (May continue Vitamin D, Vitamin B, and multivitamin.)  Wear comfortable clothing (specific to your surgery type) to the hospital.   If you are being discharged the day of surgery, you will not be allowed to drive home. You will need a responsible adult to drive you home and stay with you that night.   If you are taking public transportation, you will need to have a responsible adult with you. Please confirm with your physician that it is acceptable to use public transportation.   Please call [phone] if you have any questions about these instructions.

## 2017-07-08 LAB — CULTURE, URINE COMPREHENSIVE

## 2017-07-09 MED ORDER — CIPROFLOXACIN IN D5W 400 MG/200ML IV SOLN
400.0000 mg | INTRAVENOUS | Status: AC
  Administered 2017-07-10: 400 mg via INTRAVENOUS

## 2017-07-10 ENCOUNTER — Ambulatory Visit: Payer: BLUE CROSS/BLUE SHIELD | Admitting: Anesthesiology

## 2017-07-10 ENCOUNTER — Encounter
Admission: RE | Disposition: A | Discharge status: Home / Self Care | Payer: Self-pay | Source: Ambulatory Visit | Attending: Urology

## 2017-07-10 ENCOUNTER — Other Ambulatory Visit: Payer: Self-pay

## 2017-07-10 ENCOUNTER — Encounter: Payer: Self-pay | Admitting: *Deleted

## 2017-07-10 ENCOUNTER — Ambulatory Visit
Admission: RE | Admit: 2017-07-10 | Discharge: 2017-07-10 | Disposition: A | Discharge status: Home / Self Care | Payer: BLUE CROSS/BLUE SHIELD | Source: Ambulatory Visit | Attending: Urology | Admitting: Urology

## 2017-07-10 DIAGNOSIS — Z87891 Personal history of nicotine dependence: Secondary | ICD-10-CM | POA: Diagnosis not present

## 2017-07-10 DIAGNOSIS — J45909 Unspecified asthma, uncomplicated: Secondary | ICD-10-CM | POA: Insufficient documentation

## 2017-07-10 DIAGNOSIS — Z79899 Other long term (current) drug therapy: Secondary | ICD-10-CM | POA: Insufficient documentation

## 2017-07-10 DIAGNOSIS — Z881 Allergy status to other antibiotic agents status: Secondary | ICD-10-CM | POA: Diagnosis not present

## 2017-07-10 DIAGNOSIS — E119 Type 2 diabetes mellitus without complications: Secondary | ICD-10-CM | POA: Insufficient documentation

## 2017-07-10 DIAGNOSIS — N132 Hydronephrosis with renal and ureteral calculous obstruction: Secondary | ICD-10-CM | POA: Diagnosis present

## 2017-07-10 DIAGNOSIS — Z882 Allergy status to sulfonamides status: Secondary | ICD-10-CM | POA: Diagnosis not present

## 2017-07-10 DIAGNOSIS — Z87892 Personal history of anaphylaxis: Secondary | ICD-10-CM | POA: Insufficient documentation

## 2017-07-10 DIAGNOSIS — Z87442 Personal history of urinary calculi: Secondary | ICD-10-CM | POA: Diagnosis not present

## 2017-07-10 DIAGNOSIS — Z7984 Long term (current) use of oral hypoglycemic drugs: Secondary | ICD-10-CM | POA: Insufficient documentation

## 2017-07-10 DIAGNOSIS — Z880 Allergy status to penicillin: Secondary | ICD-10-CM | POA: Diagnosis not present

## 2017-07-10 DIAGNOSIS — Z7982 Long term (current) use of aspirin: Secondary | ICD-10-CM | POA: Insufficient documentation

## 2017-07-10 DIAGNOSIS — K219 Gastro-esophageal reflux disease without esophagitis: Secondary | ICD-10-CM | POA: Diagnosis not present

## 2017-07-10 DIAGNOSIS — N200 Calculus of kidney: Secondary | ICD-10-CM | POA: Diagnosis not present

## 2017-07-10 HISTORY — PX: CYSTOSCOPY WITH URETEROSCOPY: SHX5123

## 2017-07-10 HISTORY — PX: CYSTOSCOPY WITH STENT PLACEMENT: SHX5790

## 2017-07-10 LAB — GLUCOSE, CAPILLARY
Glucose-Capillary: 120 mg/dL — ABNORMAL HIGH (ref 65–99)
Glucose-Capillary: 122 mg/dL — ABNORMAL HIGH (ref 65–99)

## 2017-07-10 SURGERY — CYSTOSCOPY WITH URETEROSCOPY
Anesthesia: General | Site: Ureter | Laterality: Left | Wound class: Clean Contaminated

## 2017-07-10 MED ORDER — LIDOCAINE HCL (CARDIAC) PF 100 MG/5ML IV SOSY
PREFILLED_SYRINGE | INTRAVENOUS | Status: DC | PRN
  Administered 2017-07-10: 40 mg via INTRAVENOUS

## 2017-07-10 MED ORDER — MIDAZOLAM HCL 2 MG/2ML IJ SOLN
INTRAMUSCULAR | Status: AC
  Filled 2017-07-10: qty 2

## 2017-07-10 MED ORDER — PROPOFOL 10 MG/ML IV BOLUS
INTRAVENOUS | Status: DC | PRN
  Administered 2017-07-10: 150 mg via INTRAVENOUS

## 2017-07-10 MED ORDER — PROPOFOL 10 MG/ML IV BOLUS
INTRAVENOUS | Status: AC
  Filled 2017-07-10: qty 20

## 2017-07-10 MED ORDER — SCOPOLAMINE 1 MG/3DAYS TD PT72
1.0000 | MEDICATED_PATCH | TRANSDERMAL | Status: DC
  Administered 2017-07-10: 1.5 mg via TRANSDERMAL

## 2017-07-10 MED ORDER — OXYBUTYNIN CHLORIDE 5 MG PO TABS: ORAL_TABLET | ORAL | 1 refills | Status: DC

## 2017-07-10 MED ORDER — SODIUM CHLORIDE 0.9 % IV SOLN
INTRAVENOUS | Status: DC
  Administered 2017-07-10: 1000 mL via INTRAVENOUS
  Administered 2017-07-10: 08:00:00 via INTRAVENOUS

## 2017-07-10 MED ORDER — FENTANYL CITRATE (PF) 100 MCG/2ML IJ SOLN
INTRAMUSCULAR | Status: DC | PRN
  Administered 2017-07-10: 50 ug via INTRAVENOUS

## 2017-07-10 MED ORDER — IOTHALAMATE MEGLUMINE 43 % IV SOLN
INTRAVENOUS | Status: DC | PRN
  Administered 2017-07-10: 15 mL via URETHRAL

## 2017-07-10 MED ORDER — OXYCODONE-ACETAMINOPHEN 5-325 MG PO TABS: 1.0000 | ORAL_TABLET | Freq: Four times a day (QID) | ORAL | 0 refills | Status: DC | PRN

## 2017-07-10 MED ORDER — OXYCODONE HCL 5 MG PO TABS
5.0000 mg | ORAL_TABLET | Freq: Once | ORAL | Status: DC | PRN
Start: 2017-07-10 — End: 2017-07-10

## 2017-07-10 MED ORDER — OXYCODONE HCL 5 MG/5ML PO SOLN: 5.0000 mg | Freq: Once | ORAL | Status: DC | PRN

## 2017-07-10 MED ORDER — ROCURONIUM BROMIDE 100 MG/10ML IV SOLN
INTRAVENOUS | Status: DC | PRN
  Administered 2017-07-10: 10 mg via INTRAVENOUS
  Administered 2017-07-10: 40 mg via INTRAVENOUS

## 2017-07-10 MED ORDER — SCOPOLAMINE 1 MG/3DAYS TD PT72
MEDICATED_PATCH | TRANSDERMAL | Status: AC
  Administered 2017-07-10: 1.5 mg via TRANSDERMAL
  Filled 2017-07-10: qty 1

## 2017-07-10 MED ORDER — DEXAMETHASONE SODIUM PHOSPHATE 10 MG/ML IJ SOLN
INTRAMUSCULAR | Status: DC | PRN
  Administered 2017-07-10: 10 mg via INTRAVENOUS

## 2017-07-10 MED ORDER — LIDOCAINE HCL (PF) 2 % IJ SOLN
INTRAMUSCULAR | Status: AC
  Filled 2017-07-10: qty 10

## 2017-07-10 MED ORDER — CIPROFLOXACIN IN D5W 400 MG/200ML IV SOLN
INTRAVENOUS | Status: AC
  Filled 2017-07-10: qty 200

## 2017-07-10 MED ORDER — PROMETHAZINE HCL 25 MG/ML IJ SOLN: 6.2500 mg | INTRAMUSCULAR | Status: DC | PRN

## 2017-07-10 MED ORDER — MEPERIDINE HCL 50 MG/ML IJ SOLN: 6.2500 mg | INTRAMUSCULAR | Status: DC | PRN

## 2017-07-10 MED ORDER — MIDAZOLAM HCL 2 MG/2ML IJ SOLN
INTRAMUSCULAR | Status: DC | PRN
  Administered 2017-07-10: 2 mg via INTRAVENOUS

## 2017-07-10 MED ORDER — ONDANSETRON HCL 4 MG/2ML IJ SOLN
INTRAMUSCULAR | Status: DC | PRN
  Administered 2017-07-10: 4 mg via INTRAVENOUS

## 2017-07-10 MED ORDER — SUGAMMADEX SODIUM 200 MG/2ML IV SOLN
INTRAVENOUS | Status: DC | PRN
  Administered 2017-07-10: 200 mg via INTRAVENOUS

## 2017-07-10 MED ORDER — FENTANYL CITRATE (PF) 100 MCG/2ML IJ SOLN
25.0000 ug | INTRAMUSCULAR | Status: DC | PRN
Start: 2017-07-10 — End: 2017-07-10

## 2017-07-10 MED ORDER — LACTATED RINGERS IV SOLN
INTRAVENOUS | Status: DC | PRN
  Administered 2017-07-10: 09:00:00 via INTRAVENOUS

## 2017-07-10 MED ORDER — FAMOTIDINE 20 MG PO TABS
ORAL_TABLET | ORAL | Status: AC
  Administered 2017-07-10: 20 mg via ORAL
  Filled 2017-07-10: qty 1

## 2017-07-10 MED ORDER — HYDROCODONE-ACETAMINOPHEN 5-325 MG PO TABS: 1.0000 | ORAL_TABLET | ORAL | 0 refills | Status: DC | PRN

## 2017-07-10 MED ORDER — FENTANYL CITRATE (PF) 100 MCG/2ML IJ SOLN
INTRAMUSCULAR | Status: AC
  Filled 2017-07-10: qty 2

## 2017-07-10 MED ORDER — FAMOTIDINE 20 MG PO TABS
20.0000 mg | ORAL_TABLET | Freq: Once | ORAL | Status: AC
  Administered 2017-07-10: 20 mg via ORAL

## 2017-07-10 SURGICAL SUPPLY — 30 items
BAG DRAIN CYSTO-URO LG1000N (MISCELLANEOUS) ×3 IMPLANT
BASKET ZERO TIP 1.9FR (BASKET) IMPLANT
BRUSH SCRUB EZ 1% IODOPHOR (MISCELLANEOUS) ×3 IMPLANT
BSKT STON RTRVL ZERO TP 1.9FR (BASKET)
CATH URETL 5X70 OPEN END (CATHETERS) ×3 IMPLANT
CNTNR SPEC 2.5X3XGRAD LEK (MISCELLANEOUS) ×2
CONRAY 43 FOR UROLOGY 50M (MISCELLANEOUS) ×3 IMPLANT
CONT SPEC 4OZ STER OR WHT (MISCELLANEOUS) ×1
CONT SPEC 4OZ STRL OR WHT (MISCELLANEOUS) ×2
CONTAINER SPEC 2.5X3XGRAD LEK (MISCELLANEOUS) ×1 IMPLANT
DRAPE UTILITY 15X26 TOWEL STRL (DRAPES) ×3 IMPLANT
GLOVE BIO SURGEON STRL SZ8 (GLOVE) ×3 IMPLANT
GOWN STRL REUS W/ TWL LRG LVL3 (GOWN DISPOSABLE) ×4 IMPLANT
GOWN STRL REUS W/TWL LRG LVL3 (GOWN DISPOSABLE) ×6
GUIDEWIRE GREEN .038 145CM (MISCELLANEOUS) IMPLANT
INFUSOR MANOMETER BAG 3000ML (MISCELLANEOUS) ×3 IMPLANT
INTRODUCER DILATOR DOUBLE (INTRODUCER) ×3 IMPLANT
KIT TURNOVER CYSTO (KITS) ×3 IMPLANT
PACK CYSTO AR (MISCELLANEOUS) ×3 IMPLANT
SENSORWIRE 0.038 NOT ANGLED (WIRE) ×6
SET CYSTO W/LG BORE CLAMP LF (SET/KITS/TRAYS/PACK) ×3 IMPLANT
SHEATH URETERAL 12FRX35CM (MISCELLANEOUS) ×2 IMPLANT
SOL .9 NS 3000ML IRR  AL (IV SOLUTION) ×1
SOL .9 NS 3000ML IRR AL (IV SOLUTION) ×2
SOL .9 NS 3000ML IRR UROMATIC (IV SOLUTION) ×2 IMPLANT
STENT URET 6FRX24 CONTOUR (STENTS) ×3 IMPLANT
STENT URET 6FRX26 CONTOUR (STENTS) IMPLANT
SURGILUBE 2OZ TUBE FLIPTOP (MISCELLANEOUS) ×3 IMPLANT
WATER STERILE IRR 1000ML POUR (IV SOLUTION) ×3 IMPLANT
WIRE SENSOR 0.038 NOT ANGLED (WIRE) ×4 IMPLANT

## 2017-07-10 NOTE — Transfer of Care (Signed)
Immediate Anesthesia Transfer of Care Note  Patient: Gina Oneill  Procedure(s) Performed: CYSTOSCOPY WITH URETEROSCOPY (Left Ureter) CYSTOSCOPY WITH STENT PLACEMENT (Left Ureter)  Patient Location: PACU  Anesthesia Type:General  Level of Consciousness: awake  Airway & Oxygen Therapy: Patient Spontanous Breathing and Patient connected to face mask oxygen  Post-op Assessment: Report given to RN and Post -op Vital signs reviewed and stable  Post vital signs: Reviewed and stable  Last Vitals:  Vitals Value Taken Time  BP 149/81 07/10/2017  9:32 AM  Temp    Pulse 79 07/10/2017  9:32 AM  Resp 12 07/10/2017  9:32 AM  SpO2 100 % 07/10/2017  9:32 AM  Vitals shown include unvalidated device data.  Last Pain:  Vitals:   07/10/17 0806  TempSrc: Oral  PainSc: 4          Complications: No apparent anesthesia complications

## 2017-07-10 NOTE — Anesthesia Procedure Notes (Signed)
Procedure Name: Intubation Date/Time: 07/10/2017 8:45 AM Performed by: Allean Found, CRNA Pre-anesthesia Checklist: Patient identified, Emergency Drugs available, Suction available, Patient being monitored and Timeout performed Patient Re-evaluated:Patient Re-evaluated prior to induction Oxygen Delivery Method: Circle system utilized Preoxygenation: Pre-oxygenation with 100% oxygen Induction Type: IV induction Ventilation: Mask ventilation without difficulty Laryngoscope Size: Mac and 3 Grade View: Grade I Tube type: Oral Tube size: 7.0 mm Number of attempts: 1 Airway Equipment and Method: Stylet Placement Confirmation: ETT inserted through vocal cords under direct vision,  positive ETCO2 and breath sounds checked- equal and bilateral Secured at: 21 cm Tube secured with: Tape Dental Injury: Teeth and Oropharynx as per pre-operative assessment

## 2017-07-10 NOTE — Anesthesia Post-op Follow-up Note (Signed)
Anesthesia QCDR form completed.        

## 2017-07-10 NOTE — Interval H&P Note (Signed)
History and Physical Interval Note:  07/10/2017 8:09 AM  Gina Oneill  has presented today for surgery, with the diagnosis of left nephrolithiasis  The various methods of treatment have been discussed with the patient and family. After consideration of risks, benefits and other options for treatment, the patient has consented to  Procedure(s): CYSTOSCOPY/URETEROSCOPY/HOLMIUM LASER/STENT PLACEMENT (Left) as a surgical intervention .  The patient's history has been reviewed, patient examined, no change in status, stable for surgery.  I have reviewed the patient's chart and labs.  Questions were answered to the patient's satisfaction.     Deakon Frix C Shakyra Mattera

## 2017-07-10 NOTE — Anesthesia Preprocedure Evaluation (Signed)
Anesthesia Evaluation  Patient identified by MRN, date of birth, ID band Patient awake    Reviewed: Allergy & Precautions, NPO status , Patient's Chart, lab work & pertinent test results  History of Anesthesia Complications (+) PONV and history of anesthetic complications  Airway Mallampati: II  TM Distance: >3 FB Neck ROM: Full    Dental no notable dental hx.    Pulmonary neg sleep apnea, neg COPD, Current Smoker,    breath sounds clear to auscultation- rhonchi (-) wheezing      Cardiovascular Exercise Tolerance: Good (-) hypertension(-) CAD, (-) Past MI, (-) Cardiac Stents and (-) CABG  Rhythm:Regular Rate:Normal - Systolic murmurs and - Diastolic murmurs    Neuro/Psych PSYCHIATRIC DISORDERS Depression negative neurological ROS     GI/Hepatic Neg liver ROS, GERD  ,  Endo/Other  diabetes, Oral Hypoglycemic Agents  Renal/GU Renal disease: hx of nephrolithiasis.     Musculoskeletal negative musculoskeletal ROS (+)   Abdominal (+) + obese,   Peds  Hematology negative hematology ROS (+)   Anesthesia Other Findings Past Medical History: No date: Depression No date: Diabetes mellitus without complication (HCC) No date: GERD (gastroesophageal reflux disease) No date: History of kidney stones No date: Kidney stones   Reproductive/Obstetrics                             Anesthesia Physical Anesthesia Plan  ASA: II  Anesthesia Plan: General   Post-op Pain Management:    Induction: Intravenous  PONV Risk Score and Plan: 2 and Ondansetron, Dexamethasone, Midazolam and Scopolamine patch - Pre-op  Airway Management Planned: Oral ETT  Additional Equipment:   Intra-op Plan:   Post-operative Plan: Extubation in OR  Informed Consent: I have reviewed the patients History and Physical, chart, labs and discussed the procedure including the risks, benefits and alternatives for the proposed  anesthesia with the patient or authorized representative who has indicated his/her understanding and acceptance.   Dental advisory given  Plan Discussed with: CRNA and Anesthesiologist  Anesthesia Plan Comments:         Anesthesia Quick Evaluation

## 2017-07-10 NOTE — Anesthesia Postprocedure Evaluation (Signed)
Anesthesia Post Note  Patient: Alanie Syler  Procedure(s) Performed: CYSTOSCOPY WITH URETEROSCOPY (Left Ureter) CYSTOSCOPY WITH STENT PLACEMENT (Left Ureter)  Patient location during evaluation: PACU Anesthesia Type: General Level of consciousness: awake and alert and oriented Pain management: pain level controlled Vital Signs Assessment: post-procedure vital signs reviewed and stable Respiratory status: spontaneous breathing, nonlabored ventilation and respiratory function stable Cardiovascular status: blood pressure returned to baseline and stable Postop Assessment: no signs of nausea or vomiting Anesthetic complications: no     Last Vitals:  Vitals:   07/10/17 1037 07/10/17 1128  BP: (!) 165/86 (!) 150/76  Pulse: (!) 57 (!) 54  Resp: 16 16  Temp: (!) 36.3 C 36.5 C  SpO2: 100% 100%    Last Pain:  Vitals:   07/10/17 1128  TempSrc: Temporal  PainSc: 0-No pain                 Shadana Pry

## 2017-07-10 NOTE — OR Nursing (Signed)
Pt request percocet instead of norco, discussed with Dr. Virl Diamond via tele 1125 am - advises he will escript perocet and d/c with pt's pharmacy.

## 2017-07-10 NOTE — Op Note (Signed)
Preoperative diagnosis: Left nephrolithiasis  Postoperative diagnosis: Left nephrolithiasis  Procedure:  1. Cystoscopy 2. Left ureteroscopy 3. Left retrograde pyelogram with interpretation 4. Placement left ureteral stent.  Surgeon: Verna Czech. Gray Maugeri, M.D.  Anesthesia: General  Complications: None  Intraoperative findings:  1.  Calculus difficult to visualize on left retrograde pyelogram.   EBL: Minimal  Specimens: 1. None   Indication: Gina Oneill is a 57 y.o. year old patient with a 10 mm left UPJ/proximal ureteral calculus urolithiasis intermittently symptomatic. After reviewing the management options for treatment, the patient elected to proceed with the above surgical procedure(s). We have discussed the potential benefits and risks of the procedure, side effects of the proposed treatment, the likelihood of the patient achieving the goals of the procedure, and any potential problems that might occur during the procedure or recuperation. Informed consent has been obtained.  Description of procedure:  The patient was taken to the operating room and general anesthesia was induced.  The patient was placed in the dorsal lithotomy position, prepped and draped in the usual sterile fashion, and preoperative antibiotics were administered. A preoperative time-out was performed.   A 22 French cystoscope was lubricated and passed under direct vision.  The urethra was normal in caliber. Panendoscopy was performed and the bladder mucosa showed no erythema, solid or papillary lesions.  Attention was directed to the left ureteral orifice and a 0.038 Sensor wire was then advanced up the left ureter into the renal pelvis under fluoroscopic guidance.  The stone was difficult to visualize on fluoroscopy.  A dual-lumen catheter was placed over the safety wire and a second 0.38 working wire was placed under fluoroscopic guidance.    A 12/14 French ureteral access sheath was placed over the wire  however would not advance beyond the midportion of the distal ureter.  The inner stylette would not advance.  A 4.5 Fr semirigid ureteroscope was then advanced into the ureter next to the guidewire and carefully advanced proximally to the UPJ however the stone could not be visualized.   After passing the semirigid ureteroscope a second attempt at placing the access sheath was not successful.  A flexible ureteroscope was then advanced over the working wire and again would not advance beyond the midportion of the distal ureter.  At this point it was elected to place a ureteral stent to allow passive dilation and a follow-up ureteroscopy will be scheduled.  Left retrograde pyelogram was performed through a 5 [place] open-ended ureteral catheter with findings as described above.  A 6 French/24 cm double-J ureteral stent was then placed under fluoroscopic guidance with good positioning noted in the renal pelvis and bladder under fluoroscopy.  After anesthetic reversal patient was transported to PACU in stable condition.   Riki Altes, MD

## 2017-07-10 NOTE — Discharge Instructions (Signed)

## 2017-07-10 NOTE — OR Nursing (Signed)
Patient voided rose colored urine

## 2017-07-11 NOTE — Progress Notes (Signed)
Increased sugar level contacted PCP

## 2017-07-12 ENCOUNTER — Ambulatory Visit
Admission: RE | Admit: 2017-07-12 | Discharge: 2017-07-12 | Disposition: A | Discharge status: Home / Self Care | Payer: BLUE CROSS/BLUE SHIELD | Source: Ambulatory Visit | Attending: Urology | Admitting: Urology

## 2017-07-12 DIAGNOSIS — Z960 Presence of urogenital implants: Secondary | ICD-10-CM | POA: Insufficient documentation

## 2017-07-12 DIAGNOSIS — N200 Calculus of kidney: Secondary | ICD-10-CM

## 2017-07-13 ENCOUNTER — Telehealth: Payer: Self-pay

## 2017-07-13 NOTE — Telephone Encounter (Signed)
Pt informed , please schedule.

## 2017-07-13 NOTE — Telephone Encounter (Signed)
-----   Message from Riki Altes, MD sent at 07/13/2017 12:45 PM EDT ----- Calculus is well visualized on KUB.  Okay to schedule shockwave lithotripsy.

## 2017-07-17 ENCOUNTER — Other Ambulatory Visit: Payer: BLUE CROSS/BLUE SHIELD

## 2017-07-17 ENCOUNTER — Other Ambulatory Visit: Payer: Self-pay | Admitting: Radiology

## 2017-07-17 DIAGNOSIS — N200 Calculus of kidney: Secondary | ICD-10-CM

## 2017-07-17 LAB — MICROSCOPIC EXAMINATION: RBC, UA: >30 /hpf — ABNORMAL HIGH (ref 0–2)

## 2017-07-17 LAB — URINALYSIS, COMPLETE
Bilirubin, UA: NEGATIVE
GLUCOSE, UA: NEGATIVE
Ketones, UA: NEGATIVE
Nitrite, UA: NEGATIVE
PH UA: 5.5 (ref 5.0–7.5)
Specific Gravity, UA: 1.025 (ref 1.005–1.030)
Urobilinogen, Ur: 0.2 mg/dL (ref 0.2–1.0)

## 2017-07-17 MED ORDER — CIPROFLOXACIN HCL 500 MG PO TABS: 500.0000 mg | ORAL_TABLET | Freq: Two times a day (BID) | ORAL | 0 refills | Status: DC

## 2017-07-17 NOTE — Telephone Encounter (Signed)
Pt presented to office to fill out ESWL paperwork. While here, pt states she had fever of 101.4 last night & 100.8 this morning. After taking tylenol, fever lowered to 99.9. Orders received from Dr Lonna Cobb for urinalysis, urine culture, samples of Myrbetriq  daily x 7 days & prophylactic Cipro  po twice daily x 7 days. ua & culture done in office, samples given, script sent to pharmacy. Pt aware.

## 2017-07-19 ENCOUNTER — Ambulatory Visit: Payer: BLUE CROSS/BLUE SHIELD

## 2017-07-19 ENCOUNTER — Encounter: Payer: Self-pay | Admitting: *Deleted

## 2017-07-19 ENCOUNTER — Encounter
Admission: RE | Disposition: A | Discharge status: Home / Self Care | Payer: Self-pay | Source: Ambulatory Visit | Attending: Urology

## 2017-07-19 ENCOUNTER — Ambulatory Visit
Admission: RE | Admit: 2017-07-19 | Discharge: 2017-07-19 | Disposition: A | Discharge status: Home / Self Care | Payer: BLUE CROSS/BLUE SHIELD | Source: Ambulatory Visit | Attending: Urology | Admitting: Urology

## 2017-07-19 ENCOUNTER — Other Ambulatory Visit: Payer: Self-pay

## 2017-07-19 DIAGNOSIS — Z882 Allergy status to sulfonamides status: Secondary | ICD-10-CM | POA: Insufficient documentation

## 2017-07-19 DIAGNOSIS — Z79899 Other long term (current) drug therapy: Secondary | ICD-10-CM | POA: Insufficient documentation

## 2017-07-19 DIAGNOSIS — Z880 Allergy status to penicillin: Secondary | ICD-10-CM | POA: Insufficient documentation

## 2017-07-19 DIAGNOSIS — Z6830 Body mass index (BMI) 30.0-30.9, adult: Secondary | ICD-10-CM | POA: Diagnosis not present

## 2017-07-19 DIAGNOSIS — N200 Calculus of kidney: Secondary | ICD-10-CM

## 2017-07-19 DIAGNOSIS — Z881 Allergy status to other antibiotic agents status: Secondary | ICD-10-CM | POA: Insufficient documentation

## 2017-07-19 DIAGNOSIS — J45909 Unspecified asthma, uncomplicated: Secondary | ICD-10-CM | POA: Diagnosis not present

## 2017-07-19 DIAGNOSIS — Z7984 Long term (current) use of oral hypoglycemic drugs: Secondary | ICD-10-CM | POA: Diagnosis not present

## 2017-07-19 DIAGNOSIS — E119 Type 2 diabetes mellitus without complications: Secondary | ICD-10-CM | POA: Insufficient documentation

## 2017-07-19 DIAGNOSIS — N132 Hydronephrosis with renal and ureteral calculous obstruction: Secondary | ICD-10-CM | POA: Diagnosis not present

## 2017-07-19 DIAGNOSIS — E669 Obesity, unspecified: Secondary | ICD-10-CM | POA: Diagnosis not present

## 2017-07-19 DIAGNOSIS — Z87891 Personal history of nicotine dependence: Secondary | ICD-10-CM | POA: Insufficient documentation

## 2017-07-19 DIAGNOSIS — K219 Gastro-esophageal reflux disease without esophagitis: Secondary | ICD-10-CM | POA: Insufficient documentation

## 2017-07-19 DIAGNOSIS — Z7982 Long term (current) use of aspirin: Secondary | ICD-10-CM | POA: Diagnosis not present

## 2017-07-19 HISTORY — PX: EXTRACORPOREAL SHOCK WAVE LITHOTRIPSY: SHX1557

## 2017-07-19 LAB — CULTURE, URINE COMPREHENSIVE

## 2017-07-19 LAB — GLUCOSE, CAPILLARY: Glucose-Capillary: 106 mg/dL — ABNORMAL HIGH (ref 65–99)

## 2017-07-19 SURGERY — LITHOTRIPSY, ESWL
Anesthesia: Moderate Sedation | Laterality: Left

## 2017-07-19 MED ORDER — OXYCODONE-ACETAMINOPHEN 5-325 MG PO TABS: 1.0000 | ORAL_TABLET | Freq: Four times a day (QID) | ORAL | 0 refills | Status: DC | PRN

## 2017-07-19 MED ORDER — DIPHENHYDRAMINE HCL 25 MG PO CAPS
25.0000 mg | ORAL_CAPSULE | ORAL | Status: AC
  Administered 2017-07-19: 25 mg via ORAL

## 2017-07-19 MED ORDER — SODIUM CHLORIDE 0.9 % IV SOLN
INTRAVENOUS | Status: DC
  Administered 2017-07-19: 07:00:00 via INTRAVENOUS

## 2017-07-19 MED ORDER — DIAZEPAM 5 MG PO TABS
ORAL_TABLET | ORAL | Status: AC
  Filled 2017-07-19: qty 2

## 2017-07-19 MED ORDER — CIPROFLOXACIN HCL 500 MG PO TABS
500.0000 mg | ORAL_TABLET | ORAL | Status: AC
  Administered 2017-07-19: 500 mg via ORAL

## 2017-07-19 MED ORDER — ONDANSETRON HCL 4 MG/2ML IJ SOLN
INTRAMUSCULAR | Status: AC
  Filled 2017-07-19: qty 2

## 2017-07-19 MED ORDER — ONDANSETRON HCL 4 MG/2ML IJ SOLN
4.0000 mg | Freq: Once | INTRAMUSCULAR | Status: AC | PRN
  Administered 2017-07-19: 4 mg via INTRAVENOUS

## 2017-07-19 MED ORDER — DIPHENHYDRAMINE HCL 25 MG PO CAPS
ORAL_CAPSULE | ORAL | Status: AC
  Filled 2017-07-19: qty 1

## 2017-07-19 MED ORDER — DIAZEPAM 5 MG PO TABS
10.0000 mg | ORAL_TABLET | ORAL | Status: AC
  Administered 2017-07-19: 10 mg via ORAL

## 2017-07-19 NOTE — Interval H&P Note (Signed)
History and Physical Interval Note: Unable to access upper tract with ureteroscope 5/15 and stent placed. Pt opted for ESWL  07/19/2017 8:25 AM  Gina Oneill  has presented today for surgery, with the diagnosis of kidney stone  The various methods of treatment have been discussed with the patient and family. After consideration of risks, benefits and other options for treatment, the patient has consented to  Procedure(s): EXTRACORPOREAL SHOCK WAVE LITHOTRIPSY (ESWL) (Left) as a surgical intervention .  The patient's history has been reviewed, patient examined, no change in status, stable for surgery.  I have reviewed the patient's chart and labs.  Questions were answered to the patient's satisfaction.     Scott C Stoioff

## 2017-07-20 ENCOUNTER — Ambulatory Visit: Payer: BLUE CROSS/BLUE SHIELD | Admitting: Urology

## 2017-07-20 ENCOUNTER — Encounter: Payer: Self-pay | Admitting: Urology

## 2017-07-20 ENCOUNTER — Telehealth: Payer: Self-pay | Admitting: Radiology

## 2017-07-20 NOTE — Telephone Encounter (Signed)
Pt called after hours nurse line yesterday after shockwave lithotripsy performed 07/19/2017 stating temperature was 101.2. After taking tylenol temperature decreased to 99.5 & currently denies fever. Per Dr Lonna CobbStoioff, advised pt that urine culture collected prior to procedure was negative for infection but to continue taking Cipro that was prescribed prophylactically & call back if temperature spikes again. Pt voices understanding.

## 2017-07-23 ENCOUNTER — Encounter: Payer: Self-pay | Admitting: Radiology

## 2017-08-01 ENCOUNTER — Ambulatory Visit
Admission: RE | Admit: 2017-08-01 | Discharge: 2017-08-01 | Disposition: A | Discharge status: Home / Self Care | Payer: BLUE CROSS/BLUE SHIELD | Source: Ambulatory Visit | Attending: Urology | Admitting: Urology

## 2017-08-01 ENCOUNTER — Encounter: Payer: Self-pay | Admitting: Urology

## 2017-08-01 ENCOUNTER — Ambulatory Visit (INDEPENDENT_AMBULATORY_CARE_PROVIDER_SITE_OTHER): Payer: BLUE CROSS/BLUE SHIELD | Admitting: Urology

## 2017-08-01 VITALS — BP 145/84 | HR 61 | Ht 70.0 in | Wt 214.6 lb

## 2017-08-01 DIAGNOSIS — N200 Calculus of kidney: Secondary | ICD-10-CM

## 2017-08-01 DIAGNOSIS — R935 Abnormal findings on diagnostic imaging of other abdominal regions, including retroperitoneum: Secondary | ICD-10-CM | POA: Insufficient documentation

## 2017-08-01 DIAGNOSIS — Z9889 Other specified postprocedural states: Secondary | ICD-10-CM | POA: Insufficient documentation

## 2017-08-01 DIAGNOSIS — Z87442 Personal history of urinary calculi: Secondary | ICD-10-CM

## 2017-08-01 LAB — MICROSCOPIC EXAMINATION

## 2017-08-01 LAB — URINALYSIS, COMPLETE
Bilirubin, UA: NEGATIVE
GLUCOSE, UA: NEGATIVE
KETONES UA: NEGATIVE
Nitrite, UA: NEGATIVE
PROTEIN UA: NEGATIVE
Urobilinogen, Ur: 0.2 mg/dL (ref 0.2–1.0)
pH, UA: 5.5 (ref 5.0–7.5)

## 2017-08-01 MED ORDER — LIDOCAINE HCL URETHRAL/MUCOSAL 2 % EX GEL: 1.0000 "application " | Freq: Once | CUTANEOUS | Status: AC

## 2017-08-01 MED ORDER — CIPROFLOXACIN HCL 500 MG PO TABS: 500.0000 mg | ORAL_TABLET | Freq: Once | ORAL | Status: AC

## 2017-08-01 NOTE — Progress Notes (Signed)
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  A ureteral stent was placed for passive dilation and she opted shockwave lithotripsy and lieu of follow-up ureteroscopy.  The procedure was performed on 07/19/2017.  Her only complaint has been moderate to severe stent symptoms.  KUB performed today shows absence of the renal calculus and no significant calcifications along the course of the stent.  The patient is presenting today for stent removal.  Procedure:  Flexible Cystoscopy with stent removal ([id number])  Timeout was performed and the correct patient, procedure and participants were identified.    Description:  The patient was prepped and draped in the usual sterile fashion. Flexible cystosopy was performed.  The stent was visualized, grasped, and removed intact without difficulty. The patient tolerated the procedure well.  A single dose of oral antibiotics was given.  Complications:  None  Plan: She declined a 24 urine study at this time.  General stone prevention guidelines were discussed.  Follow-up in 6 months with a KUB.

## 2017-08-07 ENCOUNTER — Ambulatory Visit: Payer: BLUE CROSS/BLUE SHIELD | Admitting: Urology

## 2017-08-10 ENCOUNTER — Ambulatory Visit: Payer: BLUE CROSS/BLUE SHIELD | Admitting: Urology

## 2017-11-07 ENCOUNTER — Encounter: Payer: Self-pay | Admitting: Urology

## 2018-01-30 ENCOUNTER — Ambulatory Visit: Payer: BLUE CROSS/BLUE SHIELD | Admitting: Urology

## 2018-03-07 ENCOUNTER — Ambulatory Visit: Payer: BLUE CROSS/BLUE SHIELD | Admitting: Urology

## 2018-03-07 ENCOUNTER — Encounter: Payer: Self-pay | Admitting: Urology

## 2018-03-07 VITALS — BP 137/87 | HR 77 | Ht 70.0 in | Wt 217.9 lb

## 2018-03-07 DIAGNOSIS — Z87442 Personal history of urinary calculi: Secondary | ICD-10-CM

## 2018-03-07 NOTE — Progress Notes (Signed)
03/07/2018 2:35 PM   [name] Oneill 04-30-1960 [id number]  Referring provider: Renford DillsPolite, Ronald, MD [address] [place], [place] [address]  Chief Complaint  Patient presents with  . 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 -Initially elected ureteroscopic removal however upper tract access could not be obtained secondary to ureteral anatomy -Ureteral stent placed for passive dilation and she opted for ESWL and lieu of f/u ureteroscopy  -SHx of ESWL on 07/19/2017  -KUB on 08/01/2017 showed absence of renal calculus and no significant calcifications along course of stent -Pt presented on 08/01/2017 for stent removal  -No recurrent stones noticed by pt  -Pt did not obtain KUB prior to appt  PMH: Past Medical History:  Diagnosis Date  . Depression   . Diabetes mellitus without complication (HCC)   . GERD (gastroesophageal reflux disease)   . History of kidney stones   . Kidney stones     Surgical History: Past Surgical History:  Procedure Laterality Date  . ABDOMINAL HYSTERECTOMY    . APPENDECTOMY    . CHOLECYSTECTOMY    . CYSTOSCOPY WITH STENT PLACEMENT Left 07/10/2017   Procedure: CYSTOSCOPY WITH STENT PLACEMENT;  Surgeon: Riki AltesStoioff, Eudell Mcphee C, MD;  Location: [facility];  Service: Urology;  Laterality: Left;  . CYSTOSCOPY WITH URETEROSCOPY Left 07/10/2017   Procedure: CYSTOSCOPY WITH URETEROSCOPY;  Surgeon: Riki AltesStoioff, Boleslaus Holloway C, MD;  Location: [facility];  Service: Urology;  Laterality: Left;  . EXPLORATORY LAPAROTOMY    . EXTRACORPOREAL SHOCK WAVE LITHOTRIPSY Left 07/19/2017   Procedure: EXTRACORPOREAL SHOCK WAVE LITHOTRIPSY (ESWL);  Surgeon: Riki AltesStoioff, Rorie Delmore C, MD;  Location: [facility];  Service: Urology;  Laterality: Left;  . KIDNEY STONE SURGERY    . OSTEOTOMY MANDIBULAR    . TONSILLECTOMY      Home Medications:  Allergies as of 03/07/2018      Reactions   Penicillins Anaphylaxis   Has  patient had a PCN reaction causing immediate rash, facial/tongue/throat swelling, SOB or lightheadedness with hypotension: YES Has patient had a PCN reaction causing severe rash involving mucus membranes or skin necrosis: NO Has patient had a PCN reaction that required hospitalization: YES Has patient had a PCN reaction occurring within the last 10 years: NO If all of the above answers are "NO", then may proceed with Cephalosporin use.   Erythromycin Itching   Sulfa Antibiotics Itching   Azithromycin Itching      Medication List       Accurate as of March 07, 2018  2:35 PM. Always use your most recent med list.        Aspirin-Caffeine 845-65 MG Pack Take 1 packet by mouth 2 (two) times daily as needed (PAIN (BC POWDER)).   FLUoxetine 10 MG capsule Commonly known as:  PROZAC TAKE 1 CAPSULE BY MOUTH EVERY DAY IN THE MORNING   losartan 25 MG tablet Commonly known as:  COZAAR Take 25 mg by mouth daily.   metFORMIN 1000 MG tablet Commonly known as:  GLUCOPHAGE Take 1,000 mg by mouth 2 (two) times daily with a meal.       Allergies:  Allergies  Allergen Reactions  . Penicillins Anaphylaxis    Has patient had a PCN reaction causing immediate rash, facial/tongue/throat swelling, SOB or lightheadedness with hypotension: YES Has patient had a PCN reaction causing severe rash involving mucus membranes or skin necrosis: NO Has patient had a  PCN reaction that required hospitalization: YES Has patient had a PCN reaction occurring within the last 10 years: NO If all of the above answers are "NO", then may proceed with Cephalosporin use.   . Erythromycin Itching  . Sulfa Antibiotics Itching  . Azithromycin Itching    Family History: Family History  Problem Relation Age of Onset  . Allergies Mother   . Heart disease Mother   . Breast cancer Mother   . Allergies Father   . Allergies Sister   . Heart disease Maternal Grandmother   . Colon cancer Maternal Grandmother   .  Colon cancer Paternal Grandmother     Social History:  reports that she has been smoking cigarettes. She has a 3.75 pack-year smoking history. She has quit using smokeless tobacco.  Her smokeless tobacco use included chew and snuff. She reports current alcohol use. She reports that she does not use drugs.  ROS: UROLOGY Frequent Urination?: No Hard to postpone urination?: No Burning/pain with urination?: No Get up at night to urinate?: No Leakage of urine?: No Urine stream starts and stops?: No Trouble starting stream?: No Do you have to strain to urinate?: No Blood in urine?: Yes Urinary tract infection?: No Sexually transmitted disease?: No Injury to kidneys or bladder?: No Painful intercourse?: No Weak stream?: No Currently pregnant?: No Vaginal bleeding?: No Last menstrual period?: n  Gastrointestinal Nausea?: No Vomiting?: No Indigestion/heartburn?: No Diarrhea?: No Constipation?: No  Constitutional Fever: No Night sweats?: No Weight loss?: No Fatigue?: No  Skin Skin rash/lesions?: No Itching?: No  Eyes Blurred vision?: No Double vision?: No  Ears/Nose/Throat Sore throat?: No Sinus problems?: Yes  Hematologic/Lymphatic Swollen glands?: No Easy bruising?: No  Cardiovascular Leg swelling?: No Chest pain?: No  Respiratory Cough?: No Shortness of breath?: No  Endocrine Excessive thirst?: No  Musculoskeletal Back pain?: Yes Joint pain?: No  Neurological Headaches?: Yes Dizziness?: No  Psychologic Depression?: Yes Anxiety?: No  Physical Exam: BP 137/87 (BP Location: Left Arm, Patient Position: Sitting, Cuff Size: Normal)   Pulse 77   Ht 5\' 10"  (1.778 m)   Wt 217 lb 14.4 oz (98.8 kg)   BMI 31.27 kg/m   Constitutional:  Alert and oriented, No acute distress. HEENT: [place] AT, moist mucus membranes.  Trachea midline, no masses. Cardiovascular: No clubbing, cyanosis, or edema. Respiratory: Normal respiratory effort, no increased work of  breathing. Skin: No rashes, bruises or suspicious lesions. Neurologic: Grossly intact, no focal deficits, moving all 4 extremities. Psychiatric: Normal mood and affect.  Assessment & Plan:   1. History of left renal calculus  -KUB scheduled today; will call with results and if no stones present will f/u in 1 year   Return in about 1 year (around 03/08/2019).  [facility] HospitalNethusan Sivanesan  [facility] [address], [address] [place], [place] [address] [phone]  I, Donne Hazelethusan Sivanesan, am acting as a scribe for Dr. Lorin PicketScott C. Caitlan Chauca,  I, Riki AltesScott C Abrahm Mancia, MD, have reviewed all documentation for this visit. The documentation on 03/07/18 for the exam, diagnosis, procedures, and orders are all accurate and complete.

## 2018-03-13 ENCOUNTER — Encounter: Payer: Self-pay | Admitting: Urology

## 2018-07-04 IMAGING — CR DG ABDOMEN 1V
1 series · 2 of 2 positions shown · non-contrast
Comparison: Plain film KUBs dated 07/19/2017 and 07/12/2017

CLINICAL DATA: Follow-up for LEFT-sided kidney stone. Cystoscopy
with ureteroscopy on 07/10/2017 and lithotripsy 07/10/2017. Still
with pain and hematuria. For stent removal today.

EXAM:
ABDOMEN - 1 VIEW

[Series 1: dg abd 1 view · 0.14mm/px · 2 of 2 slices shown]
[im 1/2]
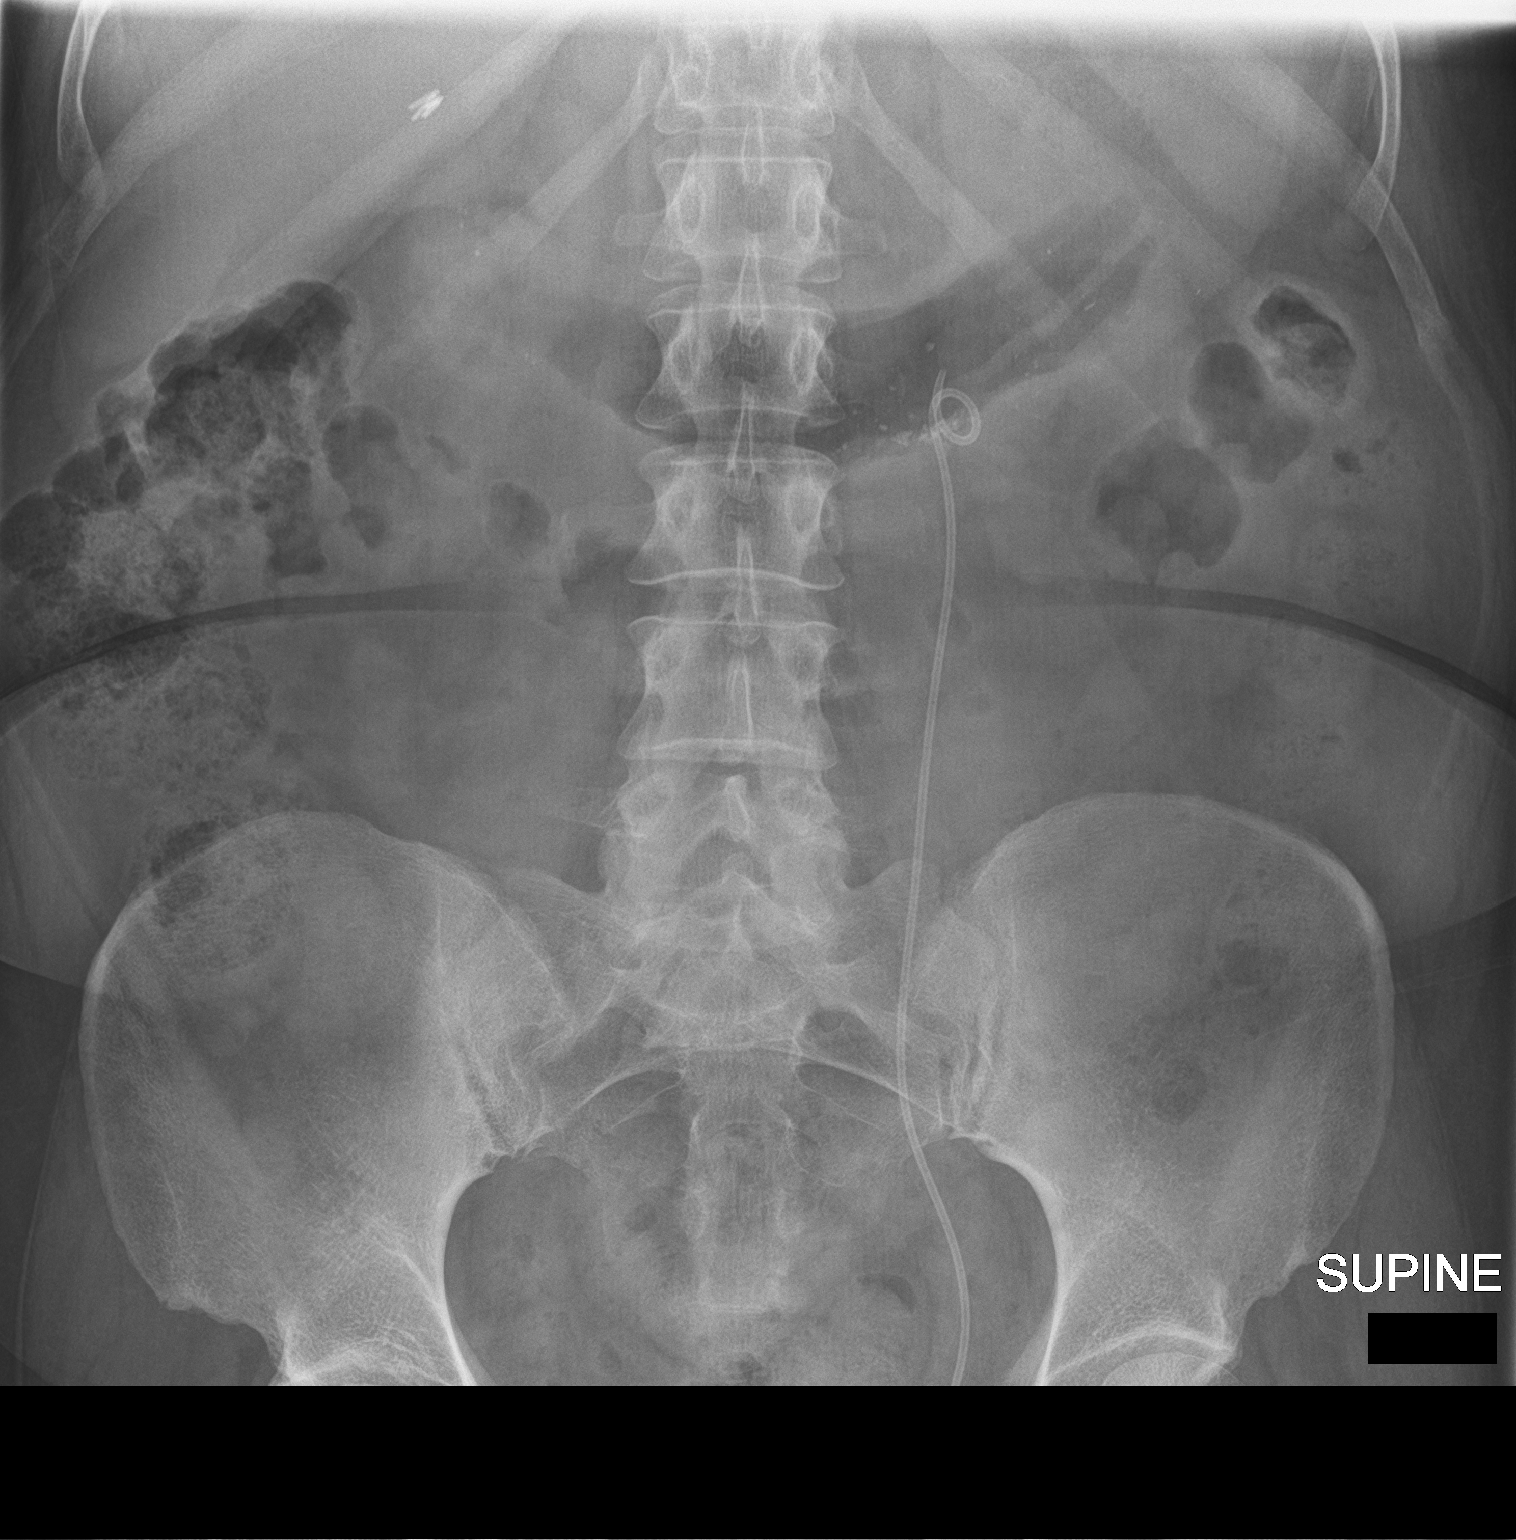
[im 2/2]
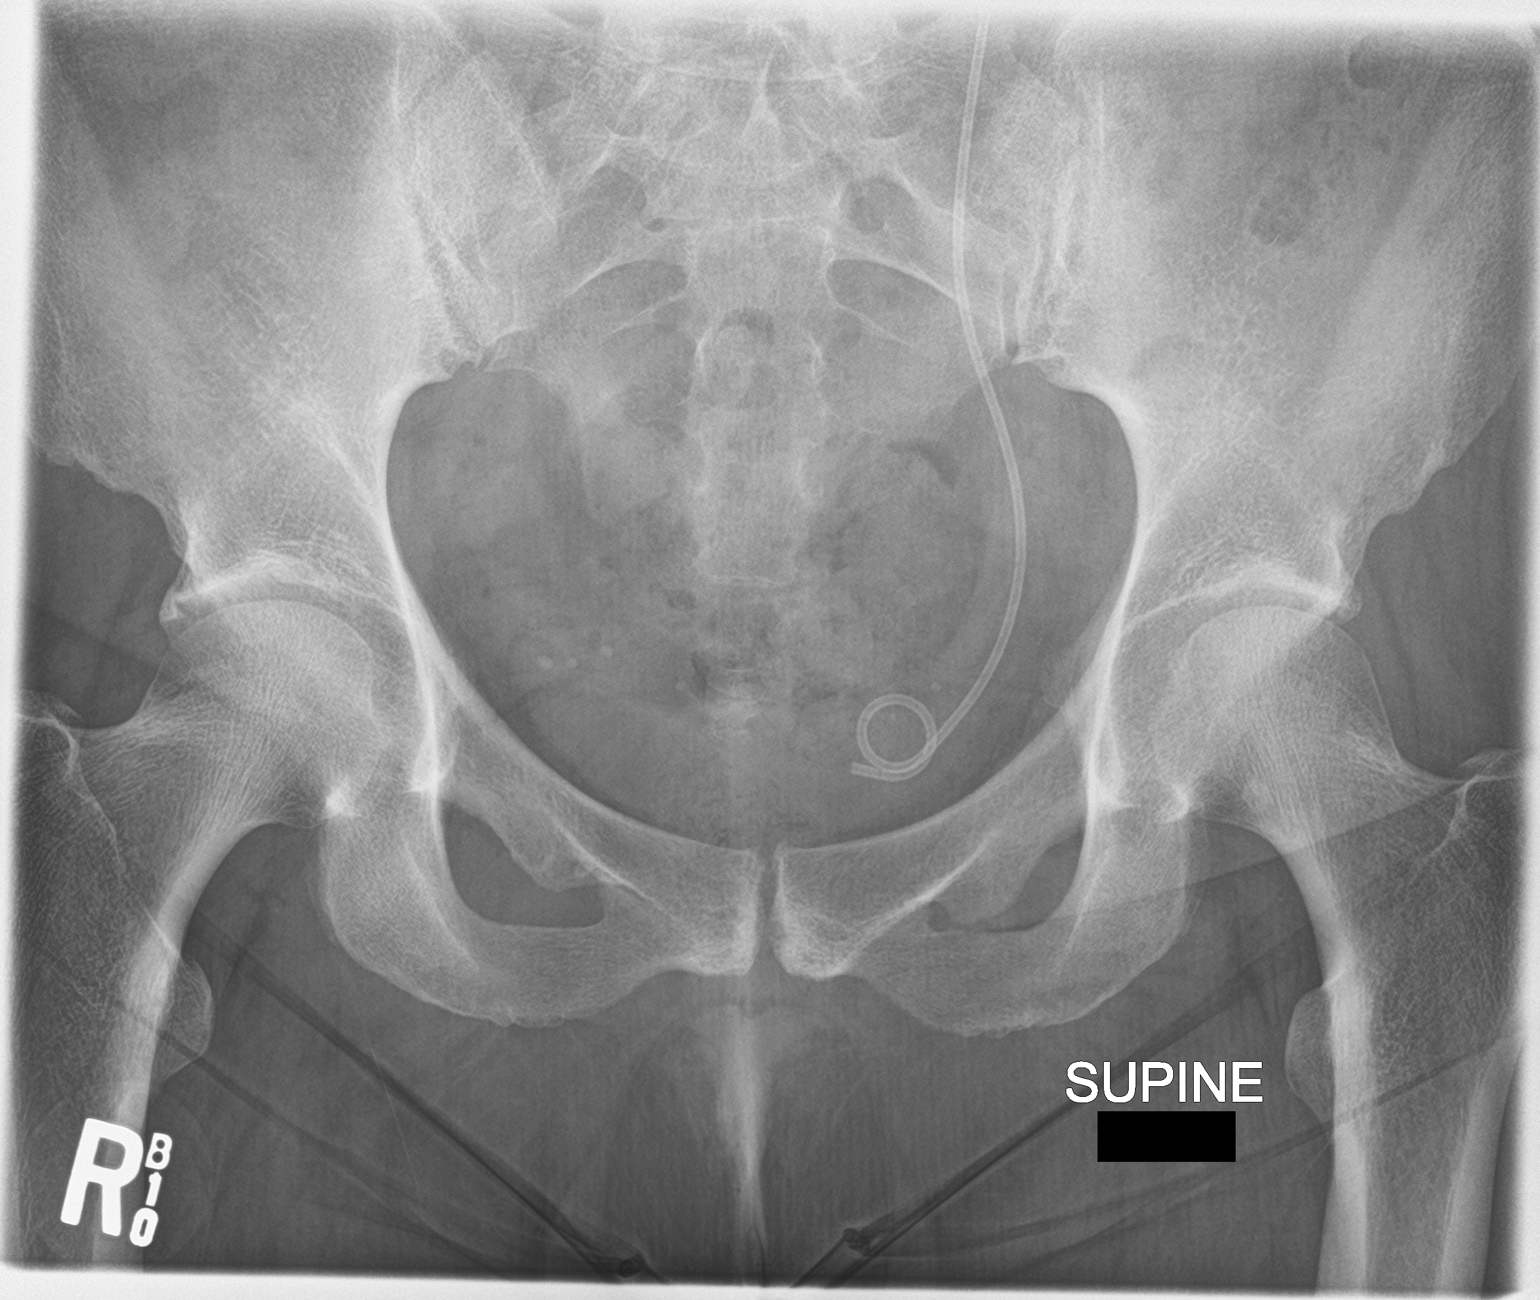

[2 of 2 positions shown; findings below may reference images not displayed]

FINDINGS: The single LEFT renal stone appreciated on previous exams is no
longer visualized. There are multiple calcific densities adjacent to
the upper portion of the LEFT nephroureteral stent, of uncertain
location relative to the renal pelvis. Stable small calcifications
within the lower pelvis are presumably chronic vascular phleboliths.

Multiple similar appearing calcific densities are seen in the LEFT
upper quadrant and RIGHT upper quadrant, following the course of the
stomach, presumably within the stomach.

Bowel gas pattern is nonobstructive. No evidence of soft tissue mass
or abnormal fluid collection. No evidence of free intraperitoneal
air. Cholecystectomy clips in the RIGHT upper quadrant. No acute or
suspicious osseous finding.
IMPRESSION: 1. The single LEFT renal stone demonstrated on previous exams is not
seen.
2. However, on today's exam, there are multiple small calcifications
adjacent to the upper portion of the nephroureteral stent, of
uncertain relationship to the renal pelvis. Additional similar
appearing calcifications extend into the LEFT upper quadrant and
RIGHT upper quadrant, following the course of the stomach,
suggesting that these calcific-like densities may all be within the
stomach.
3. Appropriate positioning of the LEFT-sided nephroureteral stent.
4. Nonobstructive bowel gas pattern.

## 2019-03-04 ENCOUNTER — Other Ambulatory Visit: Payer: Self-pay | Admitting: *Deleted

## 2019-03-04 DIAGNOSIS — N200 Calculus of kidney: Secondary | ICD-10-CM

## 2019-03-04 DIAGNOSIS — Z87442 Personal history of urinary calculi: Secondary | ICD-10-CM

## 2019-03-05 ENCOUNTER — Ambulatory Visit: Payer: BLUE CROSS/BLUE SHIELD | Admitting: Urology

## 2019-03-05 ENCOUNTER — Encounter: Payer: Self-pay | Admitting: Urology

## 2023-09-16 ENCOUNTER — Other Ambulatory Visit: Payer: Self-pay | Admitting: Medical Genetics

## 2023-12-18 ENCOUNTER — Other Ambulatory Visit: Payer: Self-pay | Admitting: Medical Genetics

## 2023-12-18 DIAGNOSIS — Z006 Encounter for examination for normal comparison and control in clinical research program: Secondary | ICD-10-CM
